# Patient Record
Sex: Female | Born: 1975 | Race: White | Hispanic: No | Marital: Single | Smoking: Former smoker
Health system: Southern US, Community
[De-identification: ages and names within clinical notes are randomized; demographics above are authoritative.]

## PROBLEM LIST (undated history)

## (undated) DIAGNOSIS — R519 Headache, unspecified: Secondary | ICD-10-CM

## (undated) DIAGNOSIS — F1910 Other psychoactive substance abuse, uncomplicated: Secondary | ICD-10-CM

## (undated) DIAGNOSIS — M5030 Other cervical disc degeneration, unspecified cervical region: Secondary | ICD-10-CM

## (undated) DIAGNOSIS — M1990 Unspecified osteoarthritis, unspecified site: Secondary | ICD-10-CM

## (undated) DIAGNOSIS — E7800 Pure hypercholesterolemia, unspecified: Secondary | ICD-10-CM

## (undated) DIAGNOSIS — E039 Hypothyroidism, unspecified: Secondary | ICD-10-CM

## (undated) DIAGNOSIS — F29 Unspecified psychosis not due to a substance or known physiological condition: Secondary | ICD-10-CM

## (undated) DIAGNOSIS — F32A Depression, unspecified: Secondary | ICD-10-CM

## (undated) DIAGNOSIS — I10 Essential (primary) hypertension: Secondary | ICD-10-CM

## (undated) DIAGNOSIS — Z87442 Personal history of urinary calculi: Secondary | ICD-10-CM

## (undated) HISTORY — PX: APPENDECTOMY: SHX54

## (undated) HISTORY — PX: CYSTOSCOPY: SUR368

---

## 2005-06-18 ENCOUNTER — Emergency Department (HOSPITAL_COMMUNITY): Admission: EM | Admit: 2005-06-18 | Discharge: 2005-06-18 | Payer: Self-pay | Admitting: Emergency Medicine

## 2005-10-15 ENCOUNTER — Emergency Department (HOSPITAL_COMMUNITY): Admission: EM | Admit: 2005-10-15 | Discharge: 2005-10-15 | Payer: Self-pay | Admitting: Emergency Medicine

## 2006-08-20 ENCOUNTER — Encounter: Admission: RE | Admit: 2006-08-20 | Discharge: 2006-08-20 | Payer: Self-pay | Admitting: Obstetrics and Gynecology

## 2008-02-04 ENCOUNTER — Ambulatory Visit: Payer: Self-pay | Admitting: Psychiatry

## 2008-02-11 ENCOUNTER — Emergency Department (HOSPITAL_COMMUNITY): Admission: EM | Admit: 2008-02-11 | Discharge: 2008-02-12 | Payer: Self-pay | Admitting: Emergency Medicine

## 2008-05-08 ENCOUNTER — Ambulatory Visit: Payer: Self-pay | Admitting: Family Medicine

## 2008-05-08 DIAGNOSIS — F329 Major depressive disorder, single episode, unspecified: Secondary | ICD-10-CM

## 2008-05-08 DIAGNOSIS — M542 Cervicalgia: Secondary | ICD-10-CM | POA: Insufficient documentation

## 2008-05-08 DIAGNOSIS — E039 Hypothyroidism, unspecified: Secondary | ICD-10-CM | POA: Insufficient documentation

## 2008-05-08 DIAGNOSIS — Z87442 Personal history of urinary calculi: Secondary | ICD-10-CM | POA: Insufficient documentation

## 2008-05-08 DIAGNOSIS — F32A Depression, unspecified: Secondary | ICD-10-CM | POA: Insufficient documentation

## 2008-05-11 ENCOUNTER — Telehealth: Payer: Self-pay | Admitting: Family Medicine

## 2008-05-20 ENCOUNTER — Telehealth: Payer: Self-pay | Admitting: *Deleted

## 2008-05-20 ENCOUNTER — Telehealth (INDEPENDENT_AMBULATORY_CARE_PROVIDER_SITE_OTHER): Payer: Self-pay | Admitting: *Deleted

## 2008-06-03 ENCOUNTER — Telehealth: Payer: Self-pay | Admitting: Family Medicine

## 2008-06-09 ENCOUNTER — Telehealth: Payer: Self-pay | Admitting: Family Medicine

## 2008-06-23 ENCOUNTER — Telehealth: Payer: Self-pay | Admitting: Family Medicine

## 2008-06-23 ENCOUNTER — Encounter: Payer: Self-pay | Admitting: Family Medicine

## 2008-07-03 ENCOUNTER — Encounter: Payer: Self-pay | Admitting: Family Medicine

## 2009-01-27 ENCOUNTER — Emergency Department (HOSPITAL_COMMUNITY): Admission: EM | Admit: 2009-01-27 | Discharge: 2009-01-28 | Payer: Self-pay | Admitting: Emergency Medicine

## 2009-04-08 ENCOUNTER — Emergency Department (HOSPITAL_COMMUNITY): Admission: EM | Admit: 2009-04-08 | Discharge: 2009-04-09 | Payer: Self-pay | Admitting: Emergency Medicine

## 2009-04-20 ENCOUNTER — Emergency Department (HOSPITAL_COMMUNITY): Admission: EM | Admit: 2009-04-20 | Discharge: 2009-04-21 | Payer: Self-pay | Admitting: Emergency Medicine

## 2009-07-21 ENCOUNTER — Emergency Department (HOSPITAL_COMMUNITY): Admission: EM | Admit: 2009-07-21 | Discharge: 2009-07-21 | Payer: Self-pay | Admitting: Emergency Medicine

## 2009-11-17 ENCOUNTER — Emergency Department (HOSPITAL_COMMUNITY): Admission: EM | Admit: 2009-11-17 | Discharge: 2009-11-17 | Payer: Self-pay | Admitting: Emergency Medicine

## 2010-08-24 LAB — DIFFERENTIAL
Basophils Absolute: 0.1 10*3/uL (ref 0.0–0.1)
Basophils Relative: 1 % (ref 0–1)
Eosinophils Absolute: 0.1 10*3/uL (ref 0.0–0.7)
Eosinophils Absolute: 0.1 10*3/uL (ref 0.0–0.7)
Eosinophils Relative: 1 % (ref 0–5)
Eosinophils Relative: 1 % (ref 0–5)
Lymphocytes Relative: 27 % (ref 12–46)
Lymphs Abs: 2.9 10*3/uL (ref 0.7–4.0)
Monocytes Absolute: 0.5 10*3/uL (ref 0.1–1.0)
Monocytes Absolute: 0.6 10*3/uL (ref 0.1–1.0)
Monocytes Relative: 5 % (ref 3–12)
Monocytes Relative: 5 % (ref 3–12)

## 2010-08-24 LAB — POCT I-STAT, CHEM 8
Calcium, Ion: 1.17 mmol/L (ref 1.12–1.32)
Calcium, Ion: 1.19 mmol/L (ref 1.12–1.32)
Chloride: 102 mEq/L (ref 96–112)
Chloride: 103 mEq/L (ref 96–112)
Creatinine, Ser: 0.7 mg/dL (ref 0.4–1.2)
Glucose, Bld: 78 mg/dL (ref 70–99)
Glucose, Bld: 89 mg/dL (ref 70–99)
HCT: 41 % (ref 36.0–46.0)
HCT: 42 % (ref 36.0–46.0)
Potassium: 3.8 mEq/L (ref 3.5–5.1)
TCO2: 27 mmol/L (ref 0–100)

## 2010-08-24 LAB — URINALYSIS, ROUTINE W REFLEX MICROSCOPIC
Bilirubin Urine: NEGATIVE
Glucose, UA: NEGATIVE mg/dL
Hgb urine dipstick: NEGATIVE
Ketones, ur: NEGATIVE mg/dL
Nitrite: NEGATIVE
Specific Gravity, Urine: 1.014 (ref 1.005–1.030)
pH: 6.5 (ref 5.0–8.0)

## 2010-08-24 LAB — URINE CULTURE: Colony Count: 7000

## 2010-08-24 LAB — CBC
HCT: 37.8 % (ref 36.0–46.0)
Hemoglobin: 12.6 g/dL (ref 12.0–15.0)
MCHC: 33.9 g/dL (ref 30.0–36.0)
MCV: 93.9 fL (ref 78.0–100.0)
MCV: 94 fL (ref 78.0–100.0)
RDW: 12.7 % (ref 11.5–15.5)
WBC: 10.1 10*3/uL (ref 4.0–10.5)

## 2010-09-02 ENCOUNTER — Emergency Department (HOSPITAL_COMMUNITY): Payer: Self-pay

## 2010-09-02 ENCOUNTER — Emergency Department (HOSPITAL_COMMUNITY)
Admission: EM | Admit: 2010-09-02 | Discharge: 2010-09-03 | Disposition: A | Discharge status: Home / Self Care | Payer: Self-pay | Attending: Emergency Medicine | Admitting: Emergency Medicine

## 2010-09-02 DIAGNOSIS — M542 Cervicalgia: Secondary | ICD-10-CM | POA: Insufficient documentation

## 2010-09-02 DIAGNOSIS — R059 Cough, unspecified: Secondary | ICD-10-CM | POA: Insufficient documentation

## 2010-09-02 DIAGNOSIS — R05 Cough: Secondary | ICD-10-CM | POA: Insufficient documentation

## 2010-09-02 DIAGNOSIS — R569 Unspecified convulsions: Secondary | ICD-10-CM | POA: Insufficient documentation

## 2010-09-02 DIAGNOSIS — E039 Hypothyroidism, unspecified: Secondary | ICD-10-CM | POA: Insufficient documentation

## 2010-09-02 DIAGNOSIS — R109 Unspecified abdominal pain: Secondary | ICD-10-CM | POA: Insufficient documentation

## 2010-09-02 DIAGNOSIS — R079 Chest pain, unspecified: Secondary | ICD-10-CM | POA: Insufficient documentation

## 2010-09-02 DIAGNOSIS — F411 Generalized anxiety disorder: Secondary | ICD-10-CM | POA: Insufficient documentation

## 2010-09-02 DIAGNOSIS — F988 Other specified behavioral and emotional disorders with onset usually occurring in childhood and adolescence: Secondary | ICD-10-CM | POA: Insufficient documentation

## 2010-09-02 DIAGNOSIS — R0989 Other specified symptoms and signs involving the circulatory and respiratory systems: Secondary | ICD-10-CM | POA: Insufficient documentation

## 2010-09-02 DIAGNOSIS — R0609 Other forms of dyspnea: Secondary | ICD-10-CM | POA: Insufficient documentation

## 2010-09-02 DIAGNOSIS — G8929 Other chronic pain: Secondary | ICD-10-CM | POA: Insufficient documentation

## 2010-09-02 DIAGNOSIS — R51 Headache: Secondary | ICD-10-CM | POA: Insufficient documentation

## 2010-09-02 DIAGNOSIS — R55 Syncope and collapse: Secondary | ICD-10-CM | POA: Insufficient documentation

## 2010-09-02 LAB — DIFFERENTIAL
Basophils Absolute: 0 10*3/uL (ref 0.0–0.1)
Basophils Relative: 0 % (ref 0–1)
Eosinophils Absolute: 0 10*3/uL (ref 0.0–0.7)
Neutro Abs: 19.2 10*3/uL — ABNORMAL HIGH (ref 1.7–7.7)
Neutrophils Relative %: 89 % — ABNORMAL HIGH (ref 43–77)

## 2010-09-02 LAB — CBC
Hemoglobin: 12.7 g/dL (ref 12.0–15.0)
MCH: 31 pg (ref 26.0–34.0)
Platelets: 381 10*3/uL (ref 150–400)
RBC: 4.1 MIL/uL (ref 3.87–5.11)
WBC: 21.4 10*3/uL — ABNORMAL HIGH (ref 4.0–10.5)

## 2010-09-02 LAB — COMPREHENSIVE METABOLIC PANEL
AST: 22 U/L (ref 0–37)
Albumin: 4.2 g/dL (ref 3.5–5.2)
Alkaline Phosphatase: 63 U/L (ref 39–117)
BUN: 8 mg/dL (ref 6–23)
Chloride: 109 mEq/L (ref 96–112)
Creatinine, Ser: 0.75 mg/dL (ref 0.4–1.2)
GFR calc Af Amer: >60 mL/min (ref >60)
Potassium: 4.1 mEq/L (ref 3.5–5.1)
Total Bilirubin: 0.5 mg/dL (ref 0.3–1.2)
Total Protein: 7 g/dL (ref 6.0–8.3)

## 2010-09-02 LAB — POCT CARDIAC MARKERS
CKMB, poc: <1 ng/mL — ABNORMAL LOW (ref 1.0–8.0)
Troponin i, poc: <0.05 ng/mL (ref 0.00–0.09)

## 2010-09-02 LAB — POCT PREGNANCY, URINE: Preg Test, Ur: NEGATIVE

## 2010-09-02 LAB — ETHANOL: Alcohol, Ethyl (B): 6 mg/dL (ref 0–10)

## 2010-09-03 LAB — URINALYSIS, ROUTINE W REFLEX MICROSCOPIC
Bilirubin Urine: NEGATIVE
Ketones, ur: NEGATIVE mg/dL
Leukocytes, UA: NEGATIVE
Nitrite: NEGATIVE
Urobilinogen, UA: 0.2 mg/dL (ref 0.0–1.0)

## 2010-09-03 LAB — RAPID URINE DRUG SCREEN, HOSP PERFORMED
Amphetamines: NOT DETECTED
Benzodiazepines: POSITIVE — AB
Cocaine: NOT DETECTED
Opiates: NOT DETECTED
Tetrahydrocannabinol: NOT DETECTED

## 2011-02-16 IMAGING — CR DG KNEE COMPLETE 4+V*R*
1 series · 1 of 1 positions shown · non-contrast
Comparison: Contralateral extremity same day.

CLINICAL DATA: Knee pain.  Fall.

RIGHT KNEE - COMPLETE 4+ VIEW

[t knee ap left]
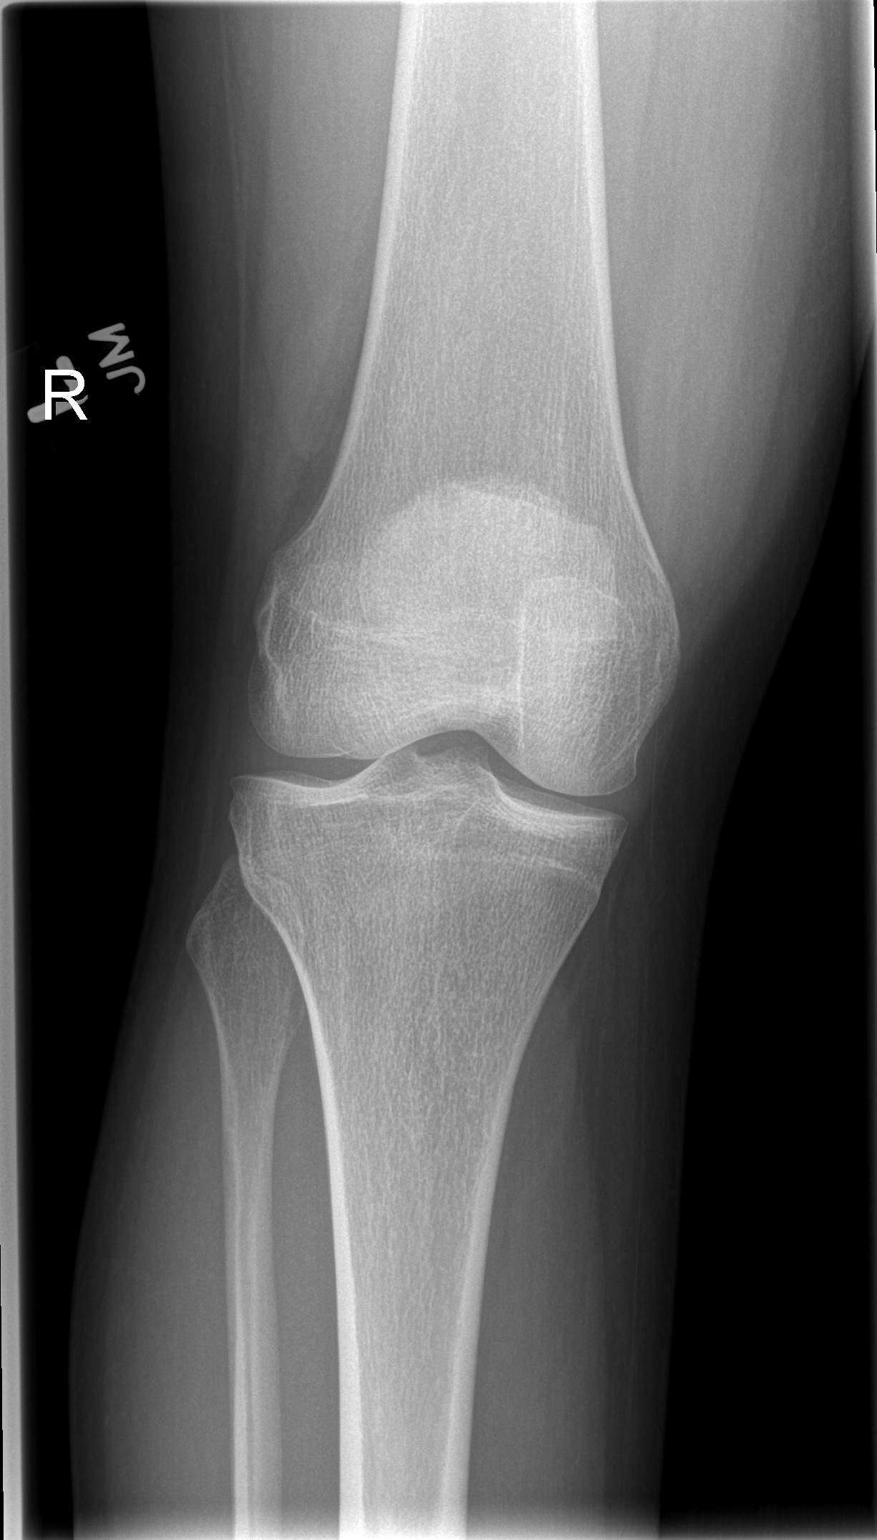

[1 of 1 positions shown; findings below may reference images not displayed]

FINDINGS: Please note that these films are incorrectly accessed
under the contralateral extremity.  The dictation correctly
[REDACTED] side with report title.  The alignment of the right knee
is anatomic.  No fracture is identified.  Question small effusion.
IMPRESSION: Question small effusion.  No fracture.

## 2011-02-16 IMAGING — CR DG CERVICAL SPINE FLEX&EXT ONLY
2 series · 2 of 2 positions shown · non-contrast
Comparison: None.

CLINICAL DATA: Cervical kyphosis noted on CT today.

CERVICAL SPINE - FLEXION AND EXTENSION VIEWS ONLY

[w c-spine flexion]
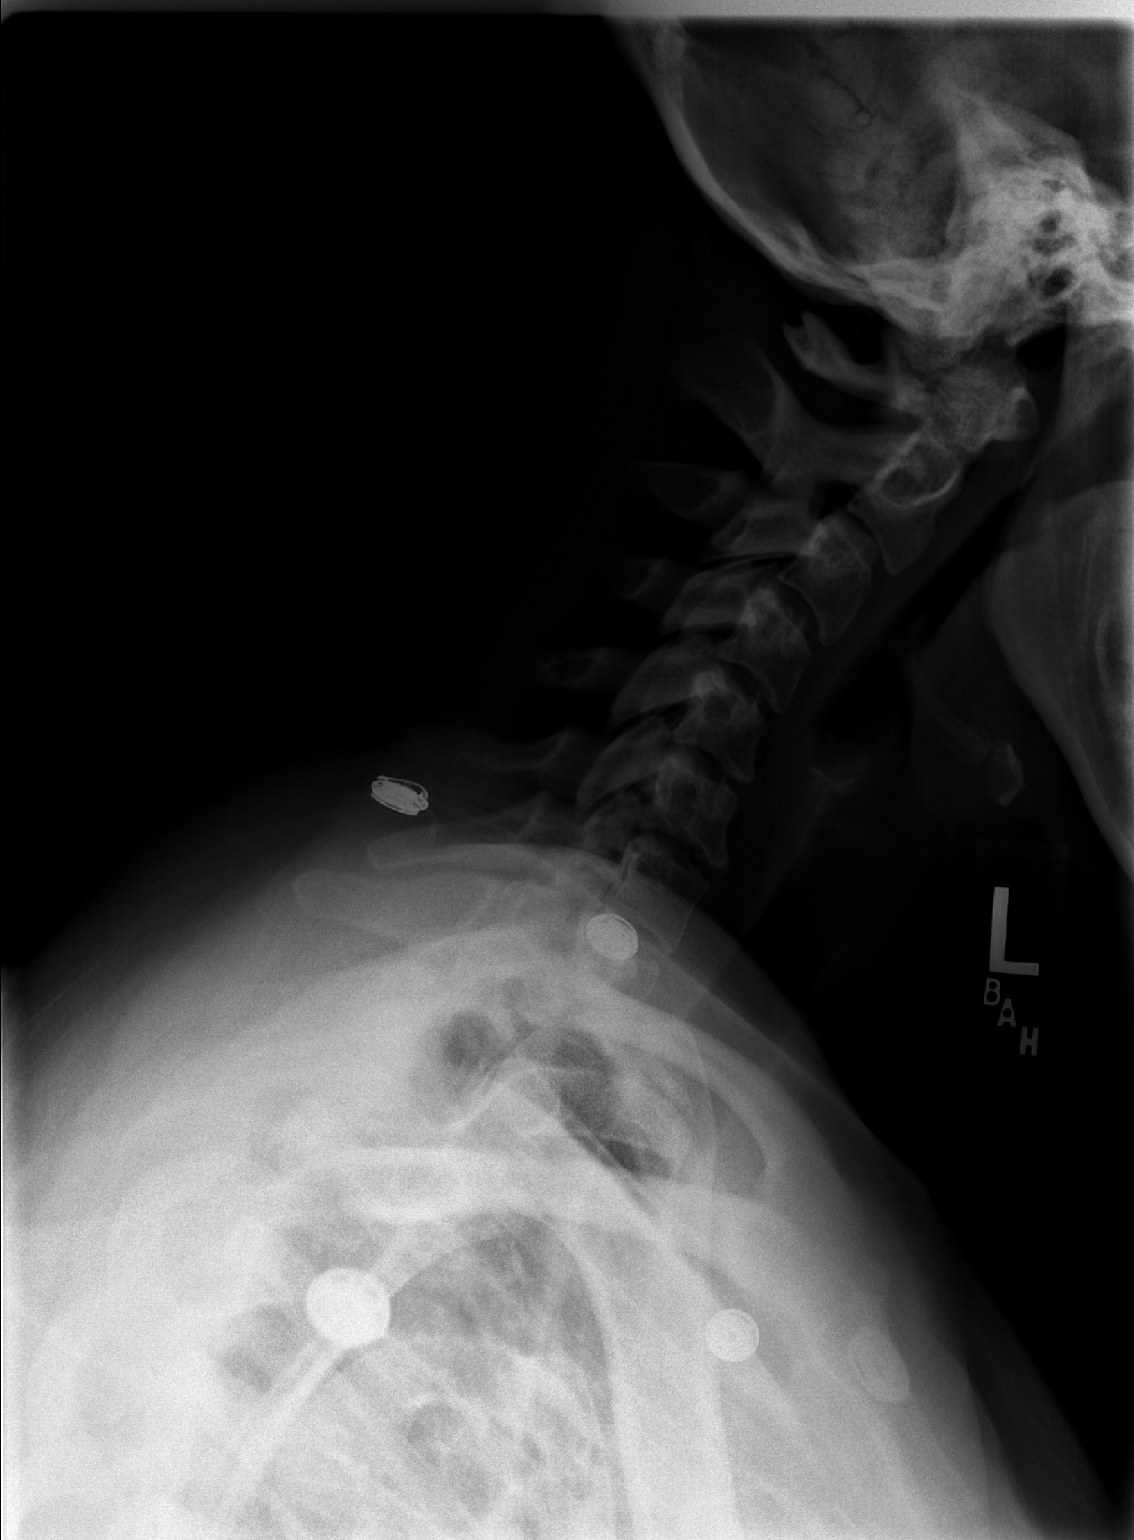

[w c-spine extension]
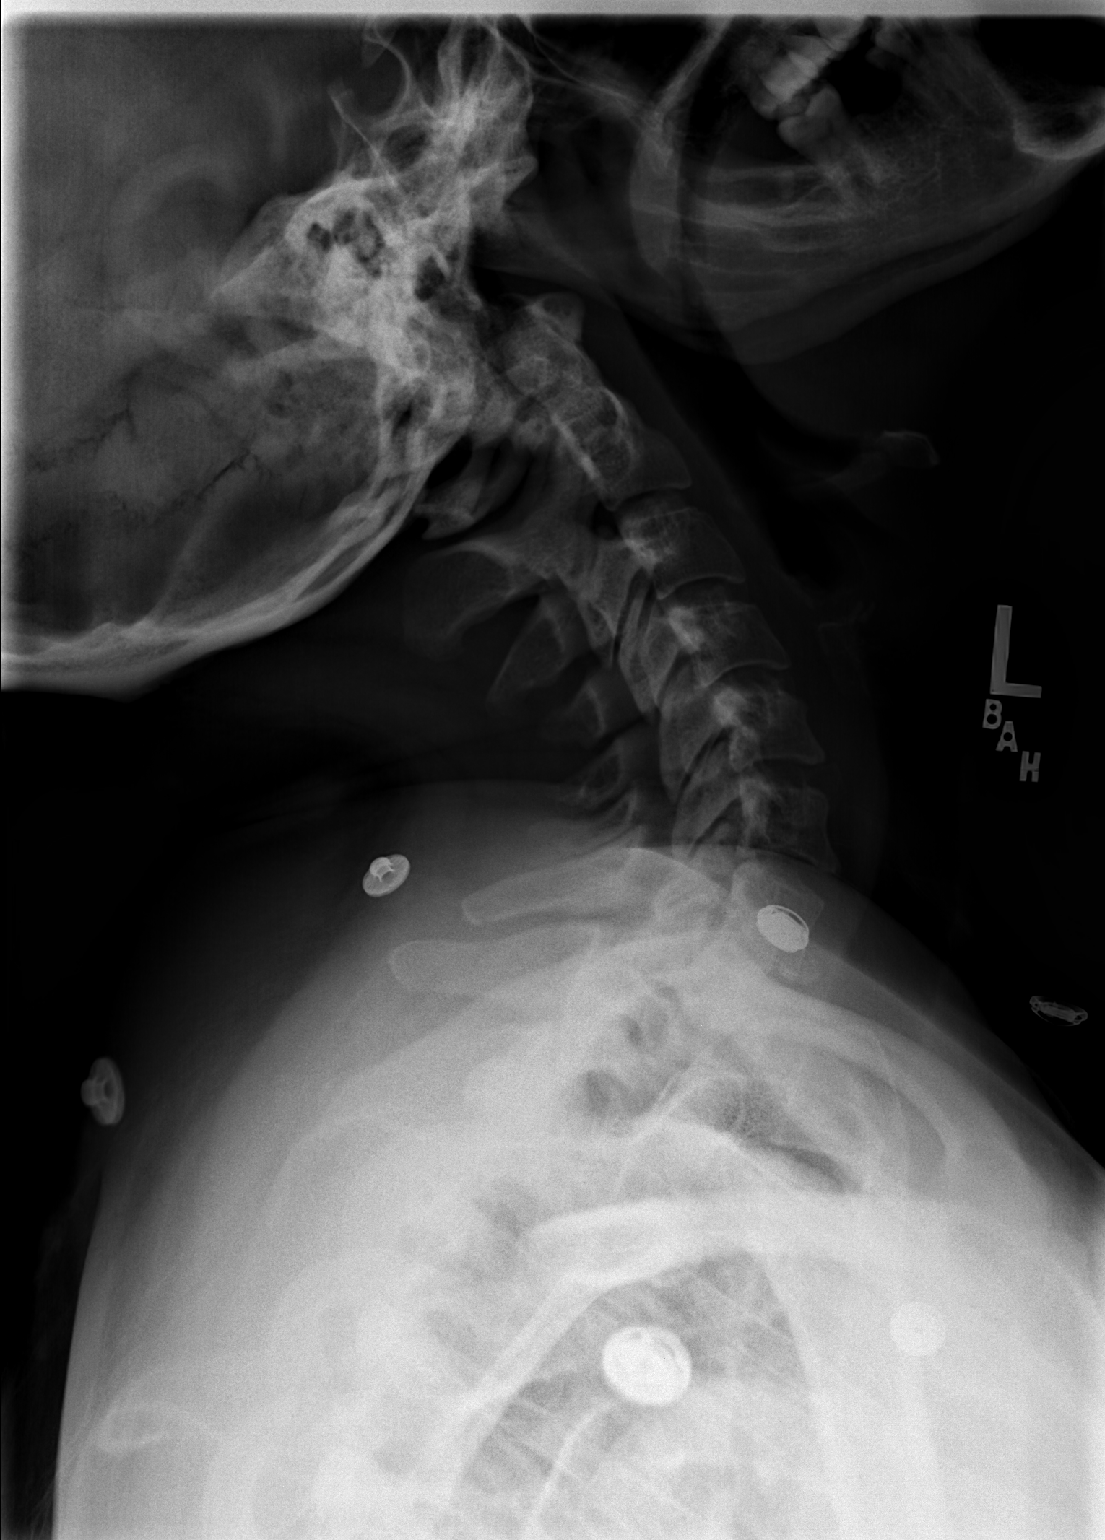

[2 of 2 positions shown; findings below may reference images not displayed]

FINDINGS: There is good range of motion with flexion and extension.
No subluxation or bony distraction.  No prevertebral soft tissue
swelling.
IMPRESSION: Negative C-spine views in flexion and extension.  No signs to
suggest instability.

## 2011-02-20 LAB — URINALYSIS, ROUTINE W REFLEX MICROSCOPIC
Ketones, ur: NEGATIVE
Leukocytes, UA: NEGATIVE
Nitrite: NEGATIVE
pH: 6.5

## 2011-02-20 LAB — CBC
Platelets: 523 — ABNORMAL HIGH
RDW: 12.6
WBC: 10

## 2011-02-20 LAB — POCT I-STAT, CHEM 8
Calcium, Ion: 1.14
Chloride: 105
HCT: 39
Potassium: 3.4 — ABNORMAL LOW
Sodium: 142

## 2011-02-20 LAB — DIFFERENTIAL
Basophils Absolute: 0.1
Eosinophils Relative: 1
Lymphocytes Relative: 25
Neutro Abs: 6.7
Neutrophils Relative %: 68

## 2011-02-20 LAB — POCT PREGNANCY, URINE: Preg Test, Ur: NEGATIVE

## 2011-02-20 LAB — URINE MICROSCOPIC-ADD ON
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
## 2017-11-26 ENCOUNTER — Encounter

## 2017-11-26 ENCOUNTER — Encounter: Payer: Self-pay | Admitting: Neurology

## 2017-11-26 ENCOUNTER — Ambulatory Visit: Payer: Commercial Managed Care - PPO | Admitting: Neurology

## 2017-11-26 DIAGNOSIS — M5412 Radiculopathy, cervical region: Secondary | ICD-10-CM | POA: Insufficient documentation

## 2017-11-26 DIAGNOSIS — M5030 Other cervical disc degeneration, unspecified cervical region: Secondary | ICD-10-CM | POA: Diagnosis not present

## 2017-11-26 DIAGNOSIS — M542 Cervicalgia: Secondary | ICD-10-CM | POA: Diagnosis not present

## 2017-11-26 DIAGNOSIS — M5416 Radiculopathy, lumbar region: Secondary | ICD-10-CM | POA: Diagnosis not present

## 2017-11-26 MED ORDER — TIZANIDINE HCL 4 MG PO TABS: ORAL_TABLET | ORAL | 3 refills | Status: DC

## 2017-11-26 MED ORDER — ETODOLAC 400 MG PO TABS: 400.0000 mg | ORAL_TABLET | Freq: Two times a day (BID) | ORAL | 5 refills | Status: DC

## 2017-11-26 MED ORDER — BUSPIRONE HCL 15 MG PO TABS: 15.0000 mg | ORAL_TABLET | Freq: Two times a day (BID) | ORAL | 5 refills | Status: DC
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
## 2017-11-27 ENCOUNTER — Telehealth: Payer: Self-pay | Admitting: Neurology

## 2017-11-27 NOTE — Telephone Encounter (Signed)
MR Cervical spine wo contrast Dr. Trilby LeaverSater [facility] Auth: [id number] (exp. 11/26/17 to 12/25/17). [facility]

## 2017-12-04 ENCOUNTER — Other Ambulatory Visit: Payer: Commercial Managed Care - PPO

## 2017-12-12 ENCOUNTER — Other Ambulatory Visit: Payer: Commercial Managed Care - PPO

## 2017-12-25 ENCOUNTER — Telehealth: Payer: Self-pay | Admitting: Neurology

## 2017-12-25 NOTE — Telephone Encounter (Signed)
Patient calling to reschedule her MRI appointment she had missed.

## 2017-12-26 NOTE — Telephone Encounter (Signed)
Got extension on the auth number of [id number] (exp. 01/24/18)

## 2017-12-26 NOTE — Telephone Encounter (Signed)
Left a voicemail for patient to call me back about rs her mri.

## 2017-12-27 MED ORDER — GABAPENTIN 600 MG PO TABS: 600.0000 mg | ORAL_TABLET | Freq: Three times a day (TID) | ORAL | 1 refills | Status: DC

## 2017-12-27 NOTE — Addendum Note (Signed)
Addended by: Candis SchatzMISENHEIMER, Mychal Durio I on: 12/27/2017 03:38 PM   Modules accepted: Orders

## 2017-12-27 NOTE — Telephone Encounter (Signed)
Patient returned my call and she is [facility].  That patient also informed me she would like to try  gabapentin.

## 2017-12-27 NOTE — Telephone Encounter (Signed)
Spoke with Kimberly Hahn.  She sts. she was on Gabapentin 600mg  QID in the past--did not see enough benefit to continue it but would l ike to try again. Ok per [facility] for 600mg  tid, and for the first 2 wks, only take 1/2 tab tid. Pt. agreeable with this plan. [facility]fim

## 2018-01-01 ENCOUNTER — Other Ambulatory Visit: Payer: Commercial Managed Care - PPO

## 2018-01-08 ENCOUNTER — Other Ambulatory Visit: Payer: Commercial Managed Care - PPO

## 2018-01-10 ENCOUNTER — Ambulatory Visit: Payer: Commercial Managed Care - PPO | Admitting: Neurology

## 2018-01-11 ENCOUNTER — Encounter: Payer: Self-pay | Admitting: Neurology

## 2018-02-14 ENCOUNTER — Telehealth: Payer: Self-pay | Admitting: Neurology

## 2018-02-14 NOTE — Telephone Encounter (Signed)
Spoke with [place]. She was seen in July for neck/back pain. Had some abnormal reflexes and so MRI c-spine was ordered. She noshowed her MRI appt., as well as the f/u appt. with [facility]. She asks if she can increase Gabapentin. I have explained that Gabapentin is increasingly a more monitored medication; already is categorized as a controlled substance in some states. Also, due to abnormal reflexes, Dr. Epimenio Foot really wants to get the MRI to make sure there is nothing that needs to be more urgently addressed. It is more important to have the MRI than mask sx. with increases in medication. She sts. [facility] told her she would need to bring $75 copay to the MRI.  Sts. she can't pay for MRI right now b/c she now owes [facility] a $50 fee for noshowed appt.  I explained I will check with billing to see if the noshow fee can be waived so that she can go ahead and have her MRI.  She verbalized understanding of same/fim

## 2018-02-14 NOTE — Telephone Encounter (Signed)
Pt is taking gabapentin (NEURONTIN) 600 MG tablet 3x day but has increased to 4tabs to get sleep benefit. She is wanting to discuss increasing the dose. She wanted to schedule an appt but has a no show fee to pay but will be unable to until next Friday. I transferred her to billing. Please call to advise.

## 2018-02-26 NOTE — Telephone Encounter (Signed)
I have left a voicemail for patient to call back about r/s her mri.

## 2018-02-26 NOTE — Telephone Encounter (Signed)
Patient has rescheduled her MRI for 03/05/18 at [facility].

## 2018-03-05 ENCOUNTER — Ambulatory Visit: Payer: Commercial Managed Care - PPO

## 2018-03-05 DIAGNOSIS — M542 Cervicalgia: Secondary | ICD-10-CM

## 2018-03-05 DIAGNOSIS — M5030 Other cervical disc degeneration, unspecified cervical region: Secondary | ICD-10-CM

## 2018-03-07 ENCOUNTER — Telehealth: Payer: Self-pay | Admitting: *Deleted

## 2018-03-07 NOTE — Telephone Encounter (Signed)
LMOM (identified vm) with below MRI report.  She does not need to return this call unless she has questions/fim 

## 2018-03-07 NOTE — Telephone Encounter (Signed)
-----   Message from Asa Lente, MD sent at 03/06/2018  6:13 PM EDT ----- Please let her know that the MRI of the cervical spine shows degenerative changes at C4-C5, C5-C6 and C6-C7.  There is no nerve root compression but that those changes could be causing some neck pain.  It does not look bad enough to refer to surgery.

## 2018-03-11 MED ORDER — GABAPENTIN 600 MG PO TABS: 600.0000 mg | ORAL_TABLET | Freq: Four times a day (QID) | ORAL | 3 refills | Status: DC

## 2018-03-11 NOTE — Telephone Encounter (Signed)
I called patient back. I relayed results of MRI per Dr. Epimenio Foot note below. She verbalized understanding.   She is wondering if Dr. Epimenio Foot can increase her dose of gabapentin. She is taking 600mg  (1 tablet TID).  She is also taking ibuprofen 800mg  daily (4-200mg  tablets) for swelling. Added to med list. She states the etodolac upset her stomach and she is unable to take this. I took off med list and added to allergy list.   Advised I will speak with Dr. Epimenio Foot and call back to advise. She verbalized understanding.

## 2018-03-11 NOTE — Addendum Note (Signed)
Addended by: Eilene Ghazi L on: 03/11/2018 10:50 AM   Modules accepted: Orders

## 2018-03-11 NOTE — Telephone Encounter (Signed)
Pt has returned the call to RN Faith, she is asking for a call back °

## 2018-03-11 NOTE — Telephone Encounter (Signed)
I called pt. Relayed Dr. Bonnita Hollow recommendations. She is agreeable to try this. She would like new rx called into pharmacy on file. Advised I will do this for her. She verbalized understanding and appreciation.

## 2018-03-11 NOTE — Addendum Note (Signed)
Addended by: Hillis Range on: 03/11/2018 02:01 PM   Modules accepted: Orders

## 2018-03-11 NOTE — Telephone Encounter (Signed)
Per Dr. Epimenio Foot- okay to increase her gabapentin 600mg  tab to 1 tab QID. She can take one in the morning, 1 in the afternoon and 2 at bed or take 1 tab four times daily.

## 2018-04-23 ENCOUNTER — Other Ambulatory Visit: Payer: Self-pay | Admitting: Neurology

## 2018-06-03 ENCOUNTER — Other Ambulatory Visit: Payer: Self-pay | Admitting: Neurology

## 2018-06-04 ENCOUNTER — Ambulatory Visit: Payer: Commercial Managed Care - PPO | Admitting: Neurology

## 2018-06-04 ENCOUNTER — Encounter

## 2018-07-03 ENCOUNTER — Other Ambulatory Visit: Payer: Self-pay | Admitting: Neurology

## 2018-07-26 ENCOUNTER — Other Ambulatory Visit: Payer: Self-pay | Admitting: Neurology

## 2018-08-06 ENCOUNTER — Other Ambulatory Visit: Payer: Self-pay | Admitting: Neurology

## 2018-08-09 ENCOUNTER — Telehealth: Payer: Self-pay | Admitting: *Deleted

## 2018-08-09 NOTE — Telephone Encounter (Signed)
Called, LVM letting pt know appt 08/12/18 cx d/t concerns over covid-19. Advised I will f/u Monday about getting this appt r/s.

## 2018-08-12 ENCOUNTER — Ambulatory Visit: Payer: Self-pay | Admitting: Neurology

## 2018-08-13 ENCOUNTER — Telehealth: Payer: Self-pay | Admitting: Neurology

## 2018-08-13 ENCOUNTER — Other Ambulatory Visit: Payer: Self-pay

## 2018-08-13 ENCOUNTER — Telehealth (INDEPENDENT_AMBULATORY_CARE_PROVIDER_SITE_OTHER): Payer: Self-pay | Admitting: Neurology

## 2018-08-13 NOTE — Telephone Encounter (Signed)
Pt called and wanted to inform us that she did get the providers message and would like to be able to touch base with the provider. She wanted to apologize that she was not able to pick up. Pt had an emergency meeting at work and it was out of her hands. Please advise .

## 2018-08-13 NOTE — Progress Notes (Signed)
Note entered in error.  Visit performed 08/14/2018

## 2018-08-13 NOTE — Telephone Encounter (Signed)
We can get her on the schedule for Wed or Thurs

## 2018-08-14 ENCOUNTER — Other Ambulatory Visit: Payer: Self-pay

## 2018-08-14 ENCOUNTER — Ambulatory Visit (INDEPENDENT_AMBULATORY_CARE_PROVIDER_SITE_OTHER): Payer: Commercial Managed Care - PPO | Admitting: Neurology

## 2018-08-14 ENCOUNTER — Encounter: Payer: Self-pay | Admitting: Neurology

## 2018-08-14 DIAGNOSIS — M542 Cervicalgia: Secondary | ICD-10-CM | POA: Diagnosis not present

## 2018-08-14 DIAGNOSIS — M5412 Radiculopathy, cervical region: Secondary | ICD-10-CM | POA: Diagnosis not present

## 2018-08-14 DIAGNOSIS — M5416 Radiculopathy, lumbar region: Secondary | ICD-10-CM | POA: Diagnosis not present

## 2018-08-14 DIAGNOSIS — G4700 Insomnia, unspecified: Secondary | ICD-10-CM | POA: Insufficient documentation

## 2018-08-14 DIAGNOSIS — F329 Major depressive disorder, single episode, unspecified: Secondary | ICD-10-CM

## 2018-08-14 DIAGNOSIS — F32A Depression, unspecified: Secondary | ICD-10-CM

## 2018-08-14 MED ORDER — GABAPENTIN 800 MG PO TABS: 800.0000 mg | ORAL_TABLET | Freq: Four times a day (QID) | ORAL | 11 refills | Status: DC

## 2018-08-14 MED ORDER — TRAMADOL HCL 50 MG PO TABS: 50.0000 mg | ORAL_TABLET | Freq: Four times a day (QID) | ORAL | 1 refills | Status: DC | PRN

## 2018-08-14 MED ORDER — DICLOFENAC SODIUM 50 MG PO TBEC: 50.0000 mg | DELAYED_RELEASE_TABLET | Freq: Two times a day (BID) | ORAL | 5 refills | Status: DC

## 2018-08-14 MED ORDER — ESZOPICLONE 2 MG PO TABS: 2.0000 mg | ORAL_TABLET | Freq: Every evening | ORAL | 1 refills | Status: DC | PRN

## 2018-08-14 NOTE — Telephone Encounter (Signed)
I called pt back. She will do telephone visit with Dr. Epimenio Foot today at 1030am. I placed on schedule.  I updated med list/allergies/pharmacy list. She will use either pharmacy on file.

## 2018-08-14 NOTE — Progress Notes (Signed)
Virtual Visit via Telephone Note I connected withLorraine Hahn on 08/14/18 at 10:45 AM EDT by telephoneand verified that I am speaking with the correct person using two identifiers.  I discussed the limitations, risks, security and privacy concerns of performing an evaluation and management service by telephone and the availability of in person appointments. I also discussed with the patient that there may be a patient responsible charge related to this service. The patient expressed understanding and agreed to proceed.   History of Present Illness: She is feeling the neck pain is radiating further down into the left shoulder.   Pain is constant but worse by the late afternoon or evening.    She denies any numbness or weakness in the left arm but has some right sided numbness.    She takes gabapentin 600 mg po 3-4 and tizanidine and ibuprofen 800 mg.  We discussed her previous MRI of the cervical spine and images were reviewed.  She notes no change in legs or gait.    She has DM and is urinating more.    She has left hip pain that is worse when she walks.  Her sister died yesterday and she is more stressed and tearful.  IMPRESSION C-spine 03/05/2018: This MRI of the cervical spine shows multilevel degenerative changes as detailed above. The most significant findings are at C4-C5, C5-C6 and C6-C7 with there are degenerative changes causing moderate foraminal narrowing but no nerve root compression or spinal stenosis.    Observations/Objective:   Assessment and Plan: Neck pain  Cervical radiculopathy  Lumbar radiculopathy  Depression, unspecified depression type  Insomnia, unspecified type  1.   For her neck pain and radiculopathy, I will change the ibuprofen to diclofenac 50 mg twice daily, increase the gabapentin and add tramadol 50 mg up to 3 times a day as needed. 2.   For insomnia I will write her for eszopiclone. 3.   She is advised to stay active and exercise as  tolerated. 4.   If depression worsens she should contact her primary care physician or [place]. 5.   Return in 4 months or sooner if there are new or worsening neurologic symptoms.   Follow Up Instructions: I discussed the assessment and treatment plan with the patient. The patient was provided an opportunity to ask questions and all were answered. The patient agreed with the plan and demonstrated an understanding of the instructions.  The patient was advised to call back or seek an in-person evaluation if the symptoms worsen or if the condition fails to improve as anticipated.  I provided 22 minutes of non-face-to-face time during this encounter.   Asa Lente, MD

## 2018-09-06 ENCOUNTER — Other Ambulatory Visit: Payer: Self-pay | Admitting: Neurology

## 2018-09-16 ENCOUNTER — Telehealth: Payer: Self-pay | Admitting: Neurology
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
## 2018-09-16 NOTE — Telephone Encounter (Signed)
Dr. Epimenio Foot- I called pharmacy and she last refilled gabapentin 09/06/18. She cannot refill until 10/06/18. She is saying she misplaced gabapentin during move. Ok to authorize a early refill?

## 2018-09-16 NOTE — Telephone Encounter (Signed)
Pt called wanting to speak to RN about her medications and a f/u visit. She also wants to talk about the last conversation she had with the provider. Pt would not go into details. Please advise.

## 2018-09-17 ENCOUNTER — Ambulatory Visit (INDEPENDENT_AMBULATORY_CARE_PROVIDER_SITE_OTHER): Payer: Commercial Managed Care - PPO | Admitting: Neurology

## 2018-09-17 ENCOUNTER — Encounter: Payer: Self-pay | Admitting: Neurology

## 2018-09-17 ENCOUNTER — Other Ambulatory Visit: Payer: Self-pay

## 2018-09-17 DIAGNOSIS — M25512 Pain in left shoulder: Secondary | ICD-10-CM

## 2018-09-17 DIAGNOSIS — G4700 Insomnia, unspecified: Secondary | ICD-10-CM | POA: Diagnosis not present

## 2018-09-17 DIAGNOSIS — M5412 Radiculopathy, cervical region: Secondary | ICD-10-CM | POA: Diagnosis not present

## 2018-09-17 DIAGNOSIS — M542 Cervicalgia: Secondary | ICD-10-CM

## 2018-09-17 MED ORDER — METHYLPREDNISOLONE 4 MG PO TABS: ORAL_TABLET | ORAL | 0 refills | Status: DC

## 2018-09-17 MED ORDER — GABAPENTIN 800 MG PO TABS: 800.0000 mg | ORAL_TABLET | Freq: Four times a day (QID) | ORAL | 11 refills | Status: DC

## 2018-09-17 NOTE — Progress Notes (Signed)
Virtual Visit via Telephone Note  I connected with Kimberly Hahn on 09/17/18 at  2:30 PM EDT by telephone and verified that I am speaking with the correct person.   I discussed the limitations, risks, security and privacy concerns of performing an evaluation and management service by telephone and the availability of in person appointments. I also discussed with the patient that there may be a patient responsible charge related to this service. The patient expressed understanding and agreed to proceed.   History of Present Illness: She is reporting pain in the left shoulder.   The pain seems located in the actual shoulder joint and radiaitng into th adjacent neck but not the arm.   Raising the arm over the head pulls but does not hurt.  Rotating the arm externally is painful.   Holding the arm internally rotated (touch right shoulder with left hand) reduces the pain.   She takes gabapentin, tizanidine and prn tramadol.     She has noted more headache.    The higher dose of gabapentin was helping the neck pain and the intermittent  radiating pain into the arm.   She lost her gabapentin in her recent move and would like an early refill  She has insomnia.   We recently started eszopiclone but it is only helping some nights.    Diclofenac helps neck more than ibuprofen.     Observations/Objective: She is alert and fully oriented with fluent speech and good attention, knowledge and memory.  Assessment and Plan: Acute pain of left shoulder  Neck pain  Insomnia, unspecified type  Cervical radiculopathy   1.   The shoulder pain could be a rotator cuff injury or bursitis.  I will call in a steroid pack to see if that helps.  If pain is not significantly better in another week we could refer to orthopedics for possible rotator cuff injury. 2.   She lost her gabapentin while moving.  I will call in an early refill this 1 time. 3.   Continue diclofenac and eszopiclone.  If insomnia does not improve  consider a switch to a different medicine. 4.   Return as scheduled or sooner if there are new or worsening neurologic symptoms.  Follow Up Instructions: I discussed the assessment and treatment plan with the patient. The patient was provided an opportunity to ask questions and all were answered. The patient agreed with the plan and demonstrated an understanding of the instructions.  The patient was advised to call back or seek an in-person evaluation if the symptoms worsen or if the condition fails to improve as anticipated.  I provided 12 minutes of non-face-to-face time during this encounter.   A. Epimenio Foot, MD, PhD, FAAN Certified in Neurology, Clinical Neurophysiology, Sleep Medicine, Pain Medicine and Neuroimaging Director, [facility]  [facility] [address] [place], [place] [address] [phone]

## 2018-09-19 ENCOUNTER — Other Ambulatory Visit: Payer: Self-pay | Admitting: Neurology

## 2018-09-20 ENCOUNTER — Other Ambulatory Visit: Payer: Self-pay | Admitting: Neurology

## 2018-09-20 NOTE — Telephone Encounter (Signed)
Pt states that her traMADol (ULTRAM) 50 MG tablet was supposed to be called in when she had her last appt and she states that the pharmacy also faxed paperwork on it and they have not received an answer. Please advise.

## 2018-09-23 MED ORDER — TRAMADOL HCL 50 MG PO TABS: 50.0000 mg | ORAL_TABLET | Freq: Four times a day (QID) | ORAL | 3 refills | Status: DC | PRN

## 2018-09-23 NOTE — Telephone Encounter (Signed)
Dr. Epimenio Foot- see below on how pt has been refilling. Is it too early to refill?

## 2018-09-23 NOTE — Telephone Encounter (Signed)
Tried [facility],  at [phone]. They are not open until 9am.  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 I checked drug registry and she last refilled on 09/11/18 #28. She also got #28 on 08/27/18, 09/01/18, 09/05/18.

## 2018-09-23 NOTE — Telephone Encounter (Signed)
Kimberly Hahn We can refill #90 with 3 refills.

## 2018-10-06 ENCOUNTER — Other Ambulatory Visit: Payer: Self-pay | Admitting: Neurology

## 2018-10-10 ENCOUNTER — Telehealth: Payer: Self-pay | Admitting: Neurology

## 2018-10-10 DIAGNOSIS — M5412 Radiculopathy, cervical region: Secondary | ICD-10-CM

## 2018-10-10 DIAGNOSIS — M542 Cervicalgia: Secondary | ICD-10-CM

## 2018-10-10 DIAGNOSIS — M25512 Pain in left shoulder: Secondary | ICD-10-CM

## 2018-10-10 MED ORDER — TRAMADOL HCL 50 MG PO TABS: ORAL_TABLET | ORAL | 0 refills | Status: DC

## 2018-10-10 MED ORDER — METHYLPREDNISOLONE 4 MG PO TABS: ORAL_TABLET | ORAL | 0 refills | Status: DC

## 2018-10-10 NOTE — Telephone Encounter (Signed)
Dr. Epimenio Foot- see message, please advise

## 2018-10-10 NOTE — Telephone Encounter (Signed)
Pt called wanting RN or provider to call her back to discuss her medications and for the referral for an Orthopedic due to her pain not getting any better. Please advise.

## 2018-10-10 NOTE — Telephone Encounter (Signed)
I sent in a steroid pack and will increase the tramadol for one month but do not want her on that high a dose long term  Ok for referral

## 2018-10-10 NOTE — Telephone Encounter (Signed)
Called pt back. She is requesting another steroid pack. Pain in left shoulder has worsened. Has  trouble raising above her head now. Requesting increase in her tramadol dose also. She is taking 50mg  tablet (1 tablet in the am, 2 tabs at lunch, 2 tabs at bedtime). Advised I will discuss with Dr. Epimenio Foot and call back.   She would like referral placed to orthopaedics. No preference as to where. I placed referral.

## 2018-10-10 NOTE — Telephone Encounter (Signed)
Called pt. Relayed Dr. Bonnita Hollow message below. She verbalized understanding and appreciation. She will call if she does not hear about scheduling appt with orthopaedics.

## 2018-10-18 ENCOUNTER — Ambulatory Visit: Payer: Self-pay | Admitting: Orthopaedic Surgery

## 2018-10-22 ENCOUNTER — Ambulatory Visit: Payer: Self-pay | Admitting: Orthopaedic Surgery

## 2018-10-29 ENCOUNTER — Ambulatory Visit: Payer: Self-pay | Admitting: Orthopaedic Surgery

## 2018-10-29 ENCOUNTER — Other Ambulatory Visit: Payer: Self-pay | Admitting: Neurology

## 2018-10-30 NOTE — Telephone Encounter (Signed)
[facility] Verified LR: 10-11-2018 Qty: 150 Pending appointment: No pending appt

## 2018-10-31 ENCOUNTER — Other Ambulatory Visit: Payer: Self-pay | Admitting: Neurology

## 2018-11-06 ENCOUNTER — Ambulatory Visit: Payer: Self-pay | Admitting: Orthopaedic Surgery

## 2018-11-07 ENCOUNTER — Other Ambulatory Visit: Payer: Self-pay | Admitting: Neurology

## 2018-11-11 ENCOUNTER — Telehealth: Payer: Self-pay | Admitting: Neurology

## 2018-11-11 NOTE — Telephone Encounter (Signed)
Called and spoke to patient she is aware of message below.    Kimberly Hahn        Thanks for trusting the care of your patient to our office. We would love to schedule an appointment, however patient was a NO SHOW for their appointment on 11/08/18 with Dr Lorin Mercy and also has cancelled this appointment 3 time(5/29,6/2, & 6/9). At this time we will not reach out to the patient to reschedule, if the appointment is still need patient may contact our office at [phone] ext. Bruce  Referral Coordinator  [place]  [phone]

## 2018-11-25 ENCOUNTER — Telehealth: Payer: Self-pay | Admitting: Neurology

## 2018-11-25 MED ORDER — TRAMADOL HCL 50 MG PO TABS: ORAL_TABLET | ORAL | 0 refills | Status: DC

## 2018-11-25 NOTE — Telephone Encounter (Signed)
Pt is asking for a call from RN to discuss a refill on all of her medications.  Pt states it has been a day and a 1/2 since she has had them.  Pt wants to discuss why with RN.  Please call

## 2018-11-25 NOTE — Telephone Encounter (Signed)
Called pt back. She went out of town for July 4th. She lost her medication bottles. She called hotel they stayed at and not found. Not at home.   Requesting refills on gabapentin and tramadol. Drug registry checked. She last refilled Tramadol 11/07/18 #150. Not due until 12/07/18. Advised I will have to get approval from Dr. Felecia Shelling. Normally controlled substances cannot be refilled early. She will call pharmacy about refill on lunesta. Drug registry checked and she last refilled 11/07/18 #30.  She does not need refill on diclofenac or tizanidine.   She also asked for Dr. Lorin Mercy office number to call and schedule appt. Provided: [phone] ext. [phone].  She recently received promotion at job.

## 2018-11-25 NOTE — Telephone Encounter (Signed)
Called, LVM for pt letting her know we are unable to fill controlled substances early.

## 2018-11-25 NOTE — Addendum Note (Signed)
Addended by: Britt Bottom on: 11/25/2018 04:27 PM   Modules accepted: Orders

## 2018-11-25 NOTE — Telephone Encounter (Signed)
Unfortunately, we cannot refill controlled substances early.

## 2018-11-25 NOTE — Addendum Note (Signed)
Addended by: Hope Pigeon on: 11/25/2018 04:22 PM   Modules accepted: Orders

## 2019-01-07 ENCOUNTER — Other Ambulatory Visit: Payer: Self-pay | Admitting: Neurology

## 2019-01-13 ENCOUNTER — Other Ambulatory Visit: Payer: Self-pay | Admitting: Neurology

## 2019-01-13 NOTE — Telephone Encounter (Deleted)
[place] drug registry has been verified. Last refill was 12/08/2018 # 150 for a 30 day supply.  

## 2019-01-13 NOTE — Telephone Encounter (Signed)
[place] drug registry has been verified. Last refill was 12/08/2018 # 150 for a 30 day supply.

## 2019-01-15 ENCOUNTER — Telehealth: Payer: Self-pay

## 2019-01-15 ENCOUNTER — Ambulatory Visit: Payer: Commercial Managed Care - PPO | Admitting: Family Medicine
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
## 2019-01-15 NOTE — Telephone Encounter (Signed)
Patient was a no call/no show for their appointment today.   

## 2019-02-12 ENCOUNTER — Other Ambulatory Visit: Payer: Self-pay | Admitting: Neurology

## 2019-02-12 NOTE — Telephone Encounter (Signed)
Reviewed pt chart. She was last seen 09/17/2018 and no showed her follow up on 01/15/19. She has no pending appt. Checked drug registry. She last refilled tramadol 01/13/2019 #150.   She received clonazepam 0.5mg  tablet on 01/24/19 #30 from Annita Brod, FNP

## 2019-03-09 ENCOUNTER — Other Ambulatory Visit: Payer: Self-pay | Admitting: Neurology

## 2019-03-10 NOTE — Telephone Encounter (Signed)
Pt last seen 09/17/2018. Needs f/u prior to refilling tramadol. Called and offered tomorrow at 4pm with Dr. Felecia Shelling. Pt unable to accept. Offered other appt with AL,NP but pt unable to accept. Scheduled appt 03/13/2019 at 2pm with Dr. Felecia Shelling. Advised her to check in no later than 1:45pm. She verbalized understanding.

## 2019-03-13 ENCOUNTER — Ambulatory Visit: Payer: Self-pay | Admitting: Neurology

## 2019-03-17 ENCOUNTER — Encounter: Payer: Self-pay | Admitting: Neurology

## 2019-08-18 ENCOUNTER — Other Ambulatory Visit: Payer: Self-pay | Admitting: Neurology

## 2019-08-19 ENCOUNTER — Other Ambulatory Visit: Payer: Self-pay | Admitting: Neurology

## 2019-11-11 ENCOUNTER — Other Ambulatory Visit: Payer: Self-pay | Admitting: Neurology

## 2019-11-16 ENCOUNTER — Other Ambulatory Visit: Payer: Self-pay | Admitting: Neurology

## 2020-01-15 ENCOUNTER — Other Ambulatory Visit: Payer: Self-pay | Admitting: Neurology

## 2020-03-26 ENCOUNTER — Other Ambulatory Visit: Payer: Self-pay | Admitting: Neurology

## 2021-03-21 ENCOUNTER — Ambulatory Visit: Payer: Commercial Managed Care - PPO | Admitting: Internal Medicine

## 2021-04-21 NOTE — Progress Notes (Deleted)
NEW PATIENT Date of Service/Encounter:  04/21/21 Referring provider: Arlan Organ, MD Primary care provider: Arlan Organ, MD  Subjective:  Ioma Chismar is a 45 y.o. female with a PMHx of hypothyroidism, depression, nephrolithiasis, insomnia presenting today for evaluation of *** History obtained from: chart review and {Persons; PED relatives w/patient:19415::"patient"}.   Most recent AEC 11/29/2020: 200 Chest x-ray 12/16/2019-no evidence of acute cardiac or pulmonary abnormality. Other allergy screening: Asthma: {Blank single:19197::"yes","no"} Rhino conjunctivitis: {Blank single:19197::"yes","no"} Food allergy: {Blank single:19197::"yes","no"} Medication allergy: {Blank single:19197::"yes","no"} Hymenoptera allergy: {Blank single:19197::"yes","no"} Urticaria: {Blank single:19197::"yes","no"} Eczema:{Blank single:19197::"yes","no"} History of recurrent infections suggestive of immunodeficency: {Blank single:19197::"yes","no"} ***Vaccinations are up to date.   Past Medical History: No past medical history on file. Medication List:  Current Outpatient Medications  Medication Sig Dispense Refill   acetaminophen (TYLENOL) 325 MG tablet Take 650 mg by mouth every 6 (six) hours as needed.     citalopram (CELEXA) 20 MG tablet      diclofenac (VOLTAREN) 50 MG EC tablet Take 1 tablet (50 mg total) by mouth 2 (two) times daily. 60 tablet 5   eszopiclone (LUNESTA) 2 MG TABS tablet TAKE 1 TABLET BY MOUTH AT BEDTIME AS NEEDED FOR SLEEP (TAKE  IMMEDIATELY  BEFORE  BEDTME) 30 tablet 5   gabapentin (NEURONTIN) 800 MG tablet Take 1 tablet (800 mg total) by mouth 4 (four) times daily. Take 1 tablet 4 times daily by mouth 120 tablet 11   levothyroxine (SYNTHROID, LEVOTHROID) 175 MCG tablet Take 175 mcg by mouth daily before breakfast.     metFORMIN (GLUCOPHAGE-XR) 500 MG 24 hr tablet Take 1 tablet by mouth daily.     methylPREDNISolone (MEDROL) 4 MG tablet Taper from 6 pills po for one  day to 1 pill po the last day over 6 days 21 tablet 0   tiZANidine (ZANAFLEX) 4 MG tablet TAKE ONE TABLET BY MOUTH UP TO THREE TIMES DAILY 90 tablet 0   traMADol (ULTRAM) 50 MG tablet TAKE ONE TABLET IN THE MORNING, TWO TABLETS IN THE EVENING AND TWO TABLETS AT BEDTIME 150 tablet 0   No current facility-administered medications for this visit.   Known Allergies:  Allergies  Allergen Reactions   Morphine Anaphylaxis   Cephalexin     REACTION: hives   Etodolac     Upset stomach   Propoxyphene N-Acetaminophen     REACTION: nausea, rash   Past Surgical History: No past surgical history on file. Family History: No family history on file. Social History: Kaimana lives ***.   ROS:  All other systems negative except as noted per HPI.  Objective:  There were no vitals taken for this visit. There is no height or weight on file to calculate BMI. Physical Exam:  General Appearance:  Alert, cooperative, no distress, appears stated age  Head:  Normocephalic, without obvious abnormality, atraumatic  Eyes:  Conjunctiva clear, EOM's intact  Nose: Nares normal  Throat: Lips, tongue normal; teeth and gums normal  Neck: Supple, symmetrical  Lungs:   Respirations unlabored, no coughing  Heart:  Appears well perfused  Extremities: No edema  Skin: Skin color, texture, turgor normal, no rashes or lesions on visualized portions of skin  Neurologic: No gross deficits     Diagnostics: Spirometry:  Tracings reviewed. Her effort: {Blank single:19197::"Good reproducible efforts.","It was hard to get consistent efforts and there is a question as to whether this reflects a maximal maneuver.","Poor effort, data can not be interpreted."} FVC: ***L FEV1: ***L, ***% predicted FEV1/FVC ratio: ***% Interpretation: {  Blank single:19197::"Spirometry consistent with mild obstructive disease","Spirometry consistent with moderate obstructive disease","Spirometry consistent with severe obstructive  disease","Spirometry consistent with possible restrictive disease","Spirometry consistent with mixed obstructive and restrictive disease","Spirometry uninterpretable due to technique","Spirometry consistent with normal pattern","No overt abnormalities noted given today's efforts"}.  Please see scanned spirometry results for details.  Skin Testing: {Blank single:19197::"Select foods","Environmental allergy panel","Environmental allergy panel and select foods","Food allergy panel","None","Deferred due to recent antihistamines use"}. Positive test to: ***. Negative test to: ***.  Results discussed with patient/family.   {Blank single:19197::"Allergy testing results were read and interpreted by myself, documented by clinical staff."," "}  Assessment and Plan  There are no Patient Instructions on file for this visit.  No follow-ups on file.  {Blank single:19197::"This note in its entirety was forwarded to the Provider who requested this consultation."}  Thank you for your kind referral. I appreciate the opportunity to take part in Shaquille's care. Please do not hesitate to contact me with questions.***  Sincerely,  Tonny Bollman, MD [facility]

## 2021-04-22 ENCOUNTER — Ambulatory Visit: Payer: Commercial Managed Care - PPO | Admitting: Internal Medicine

## 2021-11-07 ENCOUNTER — Telehealth: Payer: Self-pay | Admitting: *Deleted

## 2021-11-07 NOTE — Telephone Encounter (Signed)
If patient call back [facility].

## 2022-09-23 ENCOUNTER — Other Ambulatory Visit: Payer: Self-pay | Admitting: Neurology

## 2022-09-25 NOTE — Telephone Encounter (Signed)
Last seen on 09/17/18 per note "She takes gabapentin, tizanidine and prn tramadol. " No follow up visit scheduled   Dr.Sater Rx pending for you to deny Rx

## 2022-12-19 ENCOUNTER — Other Ambulatory Visit: Payer: Self-pay | Admitting: Neurosurgery

## 2022-12-25 ENCOUNTER — Encounter (HOSPITAL_COMMUNITY): Payer: Self-pay

## 2022-12-25 NOTE — Progress Notes (Signed)
Surgical Instructions   Your procedure is scheduled on Monday, January 01, 2023. Report to [facility] Main Entrance "A" at 10:56 A.M., then check in with the Admitting office. Any questions or running late day of surgery: call [phone]  Questions prior to your surgery date: call [phone], Monday-Friday, 8am-4pm. If you experience any cold or flu symptoms such as cough, fever, chills, shortness of breath, etc. between now and your scheduled surgery, please notify us at the above number.     Remember:  Do not eat after midnight the night before your surgery  You may drink clear liquids until 9:56 the morning of your surgery.   Clear liquids allowed are: Water, Non-Citrus Juices (without pulp), Carbonated Beverages, Clear Tea, Black Coffee Only (NO MILK, CREAM OR POWDERED CREAMER of any kind), and Gatorade.    Take these medicines the morning of surgery with A SIP OF WATER  gabapentin (NEURONTIN)  levothyroxine (SYNTHROID)  rosuvastatin (CRESTOR)  Vilazodone HCl (VIIBRYD)   May take these medicines IF NEEDED: methocarbamol (ROBAXIN)    One week prior to surgery, STOP taking any Aspirin (unless otherwise instructed by your surgeon) Aleve, Naproxen, Ibuprofen, Motrin, Advil, Goody's, BC's, all herbal medications, fish oil, and non-prescription vitamins.                     Do NOT Smoke (Tobacco/Vaping) for 24 hours prior to your procedure.  If you use a CPAP at night, you may bring your mask/headgear for your overnight stay.   You will be asked to remove any contacts, glasses, piercing's, hearing aid's, dentures/partials prior to surgery. Please bring cases for these items if needed.    Patients discharged the day of surgery will not be allowed to drive home, and someone needs to stay with them for 24 hours.  SURGICAL WAITING ROOM VISITATION Patients may have no more than 2 support people in the waiting area - these visitors may rotate.   Pre-op nurse will coordinate an  appropriate time for 1 ADULT support person, who may not rotate, to accompany patient in pre-op.  Children under the age of 80 must have an adult with them who is not the patient and must remain in the main waiting area with an adult.  If the patient needs to stay at the hospital during part of their recovery, the visitor guidelines for inpatient rooms apply.  Please refer to the [facility] website for the visitor guidelines for any additional information.   If you received a COVID test during your pre-op visit  it is requested that you wear a mask when out in public, stay away from anyone that may not be feeling well and notify your surgeon if you develop symptoms. If you have been in contact with anyone that has tested positive in the last 10 days please notify you surgeon.      Pre-operative 5 CHG Bathing Instructions   You can play a key role in reducing the risk of infection after surgery. Your skin needs to be as free of germs as possible. You can reduce the number of germs on your skin by washing with CHG (chlorhexidine gluconate) soap before surgery. CHG is an antiseptic soap that kills germs and continues to kill germs even after washing.   DO NOT use if you have an allergy to chlorhexidine/CHG or antibacterial soaps. If your skin becomes reddened or irritated, stop using the CHG and notify one of our RNs at [phone].   Please shower with the  CHG soap starting 4 days before surgery using the following schedule:     Please keep in mind the following:  DO NOT shave, including legs and underarms, starting the day of your first shower.   You may shave your face at any point before/day of surgery.  Place clean sheets on your bed the day you start using CHG soap. Use a clean washcloth (not used since being washed) for each shower. DO NOT sleep with pets once you start using the CHG.   CHG Shower Instructions:  If you choose to wash your hair and private area, wash first with  your normal shampoo/soap.  After you use shampoo/soap, rinse your hair and body thoroughly to remove shampoo/soap residue.  Turn the water OFF and apply about 3 tablespoons (45 ml) of CHG soap to a CLEAN washcloth.  Apply CHG soap ONLY FROM YOUR NECK DOWN TO YOUR TOES (washing for 3-5 minutes)  DO NOT use CHG soap on face, private areas, open wounds, or sores.  Pay special attention to the area where your surgery is being performed.  If you are having back surgery, having someone wash your back for you may be helpful. Wait 2 minutes after CHG soap is applied, then you may rinse off the CHG soap.  Pat dry with a clean towel  Put on clean clothes/pajamas   If you choose to wear lotion, please use ONLY the CHG-compatible lotions on the back of this paper.   Additional instructions for the day of surgery: DO NOT APPLY any lotions, deodorants or perfumes.   Do not bring valuables to the hospital. [facility] is not responsible for any belongings/valuables. Do not wear nail polish, gel polish, artificial nails, or any other type of covering on natural nails (fingers and toes) Do not wear jewelry or makeup Put on clean/comfortable clothes.  Please brush your teeth.  Ask your nurse before applying any prescription medications to the skin.     CHG Compatible Lotions   Aveeno Moisturizing lotion  Cetaphil Moisturizing Cream  Cetaphil Moisturizing Lotion  Clairol Herbal Essence Moisturizing Lotion, Dry Skin  Clairol Herbal Essence Moisturizing Lotion, Extra Dry Skin  Clairol Herbal Essence Moisturizing Lotion, Normal Skin  Curel Age Defying Therapeutic Moisturizing Lotion with Alpha Hydroxy  Curel Extreme Care Body Lotion  Curel Soothing Hands Moisturizing Hand Lotion  Curel Therapeutic Moisturizing Cream, Fragrance-Free  Curel Therapeutic Moisturizing Lotion, Fragrance-Free  Curel Therapeutic Moisturizing Lotion, Original Formula  Eucerin Daily Replenishing Lotion  Eucerin Dry Skin  Therapy Plus Alpha Hydroxy Crme  Eucerin Dry Skin Therapy Plus Alpha Hydroxy Lotion  Eucerin Original Crme  Eucerin Original Lotion  Eucerin Plus Crme Eucerin Plus Lotion  Eucerin TriLipid Replenishing Lotion  Keri Anti-Bacterial Hand Lotion  Keri Deep Conditioning Original Lotion Dry Skin Formula Softly Scented  Keri Deep Conditioning Original Lotion, Fragrance Free Sensitive Skin Formula  Keri Lotion Fast Absorbing Fragrance Free Sensitive Skin Formula  Keri Lotion Fast Absorbing Softly Scented Dry Skin Formula  Keri Original Lotion  Keri Skin Renewal Lotion Keri Silky Smooth Lotion  Keri Silky Smooth Sensitive Skin Lotion  Nivea Body Creamy Conditioning Oil  Nivea Body Extra Enriched Lotion  Nivea Body Original Lotion  Nivea Body Sheer Moisturizing Lotion Nivea Crme  Nivea Skin Firming Lotion  NutraDerm 30 Skin Lotion  NutraDerm Skin Lotion  NutraDerm Therapeutic Skin Cream  NutraDerm Therapeutic Skin Lotion  ProShield Protective Hand Cream  Provon moisturizing lotion  Please read over the following fact sheets that you were  given.

## 2022-12-26 ENCOUNTER — Encounter (HOSPITAL_COMMUNITY): Payer: Self-pay

## 2022-12-26 ENCOUNTER — Other Ambulatory Visit: Payer: Self-pay

## 2022-12-26 ENCOUNTER — Encounter (HOSPITAL_COMMUNITY)
Admission: RE | Admit: 2022-12-26 | Discharge: 2022-12-26 | Disposition: A | Discharge status: Home / Self Care | Payer: Medicaid Other | Source: Ambulatory Visit | Attending: Neurosurgery | Admitting: Neurosurgery

## 2022-12-26 VITALS — BP 145/81 | HR 72 | Temp 98.4°F | Resp 18 | Ht 66.0 in | Wt 158.2 lb

## 2022-12-26 DIAGNOSIS — M5412 Radiculopathy, cervical region: Secondary | ICD-10-CM

## 2022-12-26 DIAGNOSIS — Z01818 Encounter for other preprocedural examination: Secondary | ICD-10-CM | POA: Diagnosis present

## 2022-12-26 DIAGNOSIS — M5416 Radiculopathy, lumbar region: Secondary | ICD-10-CM | POA: Insufficient documentation

## 2022-12-26 DIAGNOSIS — Z01812 Encounter for preprocedural laboratory examination: Secondary | ICD-10-CM | POA: Diagnosis not present

## 2022-12-26 HISTORY — DX: Headache, unspecified: R51.9

## 2022-12-26 HISTORY — DX: Personal history of urinary calculi: Z87.442

## 2022-12-26 HISTORY — DX: Pure hypercholesterolemia, unspecified: E78.00

## 2022-12-26 HISTORY — DX: Depression, unspecified: F32.A

## 2022-12-26 HISTORY — DX: Unspecified osteoarthritis, unspecified site: M19.90

## 2022-12-26 HISTORY — DX: Essential (primary) hypertension: I10

## 2022-12-26 HISTORY — DX: Other psychoactive substance abuse, uncomplicated: F19.10

## 2022-12-26 HISTORY — DX: Hypothyroidism, unspecified: E03.9

## 2022-12-26 HISTORY — DX: Other cervical disc degeneration, unspecified cervical region: M50.30

## 2022-12-26 HISTORY — DX: Unspecified psychosis not due to a substance or known physiological condition: F29

## 2022-12-26 LAB — BASIC METABOLIC PANEL
Anion gap: 7 (ref 5–15)
BUN: 7 mg/dL (ref 6–20)
CO2: 28 mmol/L (ref 22–32)
Calcium: 9.2 mg/dL (ref 8.9–10.3)
Chloride: 104 mmol/L (ref 98–111)
Creatinine, Ser: 0.74 mg/dL (ref 0.44–1.00)
GFR, Estimated: >60 mL/min (ref >60)
Glucose, Bld: 107 mg/dL — ABNORMAL HIGH (ref 70–99)
Potassium: 4.1 mmol/L (ref 3.5–5.1)
Sodium: 139 mmol/L (ref 135–145)

## 2022-12-26 LAB — CBC
HCT: 41.5 % (ref 36.0–46.0)
Hemoglobin: 13.5 g/dL (ref 12.0–15.0)
MCH: 31.4 pg (ref 26.0–34.0)
MCHC: 32.5 g/dL (ref 30.0–36.0)
MCV: 96.5 fL (ref 80.0–100.0)
Platelets: 375 10*3/uL (ref 150–400)
RBC: 4.3 MIL/uL (ref 3.87–5.11)
RDW: 11.9 % (ref 11.5–15.5)
WBC: 6.7 10*3/uL (ref 4.0–10.5)
nRBC: 0 % (ref 0.0–0.2)

## 2022-12-26 LAB — TYPE AND SCREEN
ABO/RH(D): B POS
Antibody Screen: NEGATIVE

## 2022-12-26 NOTE — Progress Notes (Addendum)
PCP - Vertell Limber, NP  Cardiologist - no  EP-no  Endocrine-no  Pulm-no  Chest x -ray -   EKG - na  Stress Test - no  ECHO - no  Cardiac Cath - no  AICD-no PM-no LOOP-no  Nerve Stimulator-no  Dialysis-no  Sleep Study - no CPAP - no  LABS-CBC, T/S, BMP  ASA-no  ERAS-yes  GLP-1 no Fasting Blood Sugar - na Checks Blood Sugar __na___ times a day  Anesthesia-  Ms Penner' blood pressure was  145/81 on arrival at PAT, patient states that blood pressure is not normally that high, blood pressure was 104/82 at PCP's appointment on 10/25/22.   Pt denies having chest pain, sob, or fever at this time. All instructions explained to the pt, with a verbal understanding of the material. Pt agrees to go over the instructions while at home for a better understanding. The opportunity to ask questions was provided.   I was going over instructions, Ms Sauer took the papers from me-patient said, "I am not going use the soap , I use antibacteria soap all of the time."  Ms Whittaker then said what else do we have to do, I have to pick my brace at 3 pm,  ( time was 1355). I informed patient that we need to draw blood and I need to do a nasal swab to check or MRSA- patient said, "you are not swabbing my nose."  I notified Vanessa at Dr. Lindalou Hose office of patients refusal of PCR and that patient said that she is not using the CHG.  After signing OR Consent, Ms. Albarran held her phone up and was going to take a picture, I told patient that she will have to speak with Medical after surgery, then she can see the records. Patient said , I do not under stand it is my surgery, I informed patient that it is hospital property and she has to follow the hospital policy.  Ms. Knotek said that she has a case and that her representative needs the information, "I"ll have them to get the records."

## 2023-01-01 ENCOUNTER — Inpatient Hospital Stay (HOSPITAL_COMMUNITY): Admission: RE | Admit: 2023-01-01 | Payer: Medicaid Other | Source: Home / Self Care | Admitting: Neurosurgery

## 2023-01-01 ENCOUNTER — Encounter (HOSPITAL_COMMUNITY): Admission: RE | Payer: Self-pay | Source: Home / Self Care

## 2023-01-01 DIAGNOSIS — Z01818 Encounter for other preprocedural examination: Secondary | ICD-10-CM

## 2023-01-01 DIAGNOSIS — M5416 Radiculopathy, lumbar region: Secondary | ICD-10-CM

## 2023-01-01 SURGERY — ANTERIOR CERVICAL DECOMPRESSION/DISCECTOMY FUSION 3 LEVELS
Anesthesia: General

## 2023-05-17 ENCOUNTER — Other Ambulatory Visit: Payer: Self-pay | Admitting: Neurosurgery

## 2023-05-28 NOTE — Progress Notes (Signed)
 Surgical Instructions   Your procedure is scheduled on June 04, 2023. Report to [facility] Main Entrance A at 10:15 A.M., then check in with the Admitting office. Any questions or running late day of surgery: call [phone]  Questions prior to your surgery date: call [phone], Monday-Friday, 8am-4pm. If you experience any cold or flu symptoms such as cough, fever, chills, shortness of breath, etc. between now and your scheduled surgery, please notify us  at the above number.     Remember:  Do not eat after midnight the night before your surgery  You may drink clear liquids until 9:15 the morning of your surgery.   Clear liquids allowed are: Water, Non-Citrus Juices (without pulp), Carbonated Beverages, Clear Tea (no milk, honey, etc.), Black Coffee Only (NO MILK, CREAM OR POWDERED CREAMER of any kind), and Gatorade.    Take these medicines the morning of surgery with A SIP OF WATER  gabapentin  (NEURONTIN )  levothyroxine  (SYNTHROID )  Vilazodone  HCl (VIIBRYD )   May take these medicines IF NEEDED: tiZANidine  (ZANAFLEX )    One week prior to surgery, STOP taking any Aspirin (unless otherwise instructed by your surgeon) Aleve, Naproxen, Ibuprofen, Motrin, Advil, Goody's, BC's, all herbal medications, fish oil, and non-prescription vitamins.                     Do NOT Smoke (Tobacco/Vaping) for 24 hours prior to your procedure.  If you use a CPAP at night, you may bring your mask/headgear for your overnight stay.   You will be asked to remove any contacts, glasses, piercing's, hearing aid's, dentures/partials prior to surgery. Please bring cases for these items if needed.    Patients discharged the day of surgery will not be allowed to drive home, and someone needs to stay with them for 24 hours.  SURGICAL WAITING ROOM VISITATION Patients may have no more than 2 support people in the waiting area - these visitors may rotate.   Pre-op nurse will coordinate an appropriate  time for 1 ADULT support person, who may not rotate, to accompany patient in pre-op.  Children under the age of 51 must have an adult with them who is not the patient and must remain in the main waiting area with an adult.  If the patient needs to stay at the hospital during part of their recovery, the visitor guidelines for inpatient rooms apply.  Please refer to the [facility] website for the visitor guidelines for any additional information.   If you received a COVID test during your pre-op visit  it is requested that you wear a mask when out in public, stay away from anyone that may not be feeling well and notify your surgeon if you develop symptoms. If you have been in contact with anyone that has tested positive in the last 10 days please notify you surgeon.      Pre-operative 5 CHG Bathing Instructions   You can play a key role in reducing the risk of infection after surgery. Your skin needs to be as free of germs as possible. You can reduce the number of germs on your skin by washing with CHG (chlorhexidine  gluconate) soap before surgery. CHG is an antiseptic soap that kills germs and continues to kill germs even after washing.   DO NOT use if you have an allergy to chlorhexidine /CHG or antibacterial soaps. If your skin becomes reddened or irritated, stop using the CHG and notify one of our RNs at [phone].   Please shower with the  CHG soap starting 4 days before surgery using the following schedule:     Please keep in mind the following:  DO NOT shave, including legs and underarms, starting the day of your first shower.   You may shave your face at any point before/day of surgery.  Place clean sheets on your bed the day you start using CHG soap. Use a clean washcloth (not used since being washed) for each shower. DO NOT sleep with pets once you start using the CHG.   CHG Shower Instructions:  Wash your face and private area with normal soap. If you choose to wash your  hair, wash first with your normal shampoo.  After you use shampoo/soap, rinse your hair and body thoroughly to remove shampoo/soap residue.  Turn the water OFF and apply about 3 tablespoons (45 ml) of CHG soap to a CLEAN washcloth.  Apply CHG soap ONLY FROM YOUR NECK DOWN TO YOUR TOES (washing for 3-5 minutes)  DO NOT use CHG soap on face, private areas, open wounds, or sores.  Pay special attention to the area where your surgery is being performed.  If you are having back surgery, having someone wash your back for you may be helpful. Wait 2 minutes after CHG soap is applied, then you may rinse off the CHG soap.  Pat dry with a clean towel  Put on clean clothes/pajamas   If you choose to wear lotion, please use ONLY the CHG-compatible lotions that are listed below.  Additional instructions for the day of surgery: DO NOT APPLY any lotions, deodorants, cologne, or perfumes.   Do not bring valuables to the hospital. [facility] is not responsible for any belongings/valuables. Do not wear nail polish, gel polish, artificial nails, or any other type of covering on natural nails (fingers and toes) Do not wear jewelry or makeup Put on clean/comfortable clothes.  Please brush your teeth.  Ask your nurse before applying any prescription medications to the skin.     CHG Compatible Lotions   Aveeno Moisturizing lotion  Cetaphil Moisturizing Cream  Cetaphil Moisturizing Lotion  Clairol Herbal Essence Moisturizing Lotion, Dry Skin  Clairol Herbal Essence Moisturizing Lotion, Extra Dry Skin  Clairol Herbal Essence Moisturizing Lotion, Normal Skin  Curel Age Defying Therapeutic Moisturizing Lotion with Alpha Hydroxy  Curel Extreme Care Body Lotion  Curel Soothing Hands Moisturizing Hand Lotion  Curel Therapeutic Moisturizing Cream, Fragrance-Free  Curel Therapeutic Moisturizing Lotion, Fragrance-Free  Curel Therapeutic Moisturizing Lotion, Original Formula  Eucerin Daily Replenishing Lotion   Eucerin Dry Skin Therapy Plus Alpha Hydroxy Crme  Eucerin Dry Skin Therapy Plus Alpha Hydroxy Lotion  Eucerin Original Crme  Eucerin Original Lotion  Eucerin Plus Crme Eucerin Plus Lotion  Eucerin TriLipid Replenishing Lotion  Keri Anti-Bacterial Hand Lotion  Keri Deep Conditioning Original Lotion Dry Skin Formula Softly Scented  Keri Deep Conditioning Original Lotion, Fragrance Free Sensitive Skin Formula  Keri Lotion Fast Absorbing Fragrance Free Sensitive Skin Formula  Keri Lotion Fast Absorbing Softly Scented Dry Skin Formula  Keri Original Lotion  Keri Skin Renewal Lotion Keri Silky Smooth Lotion  Keri Silky Smooth Sensitive Skin Lotion  Nivea Body Creamy Conditioning Oil  Nivea Body Extra Enriched Teacher, Adult Education Moisturizing Lotion Nivea Crme  Nivea Skin Firming Lotion  NutraDerm 30 Skin Lotion  NutraDerm Skin Lotion  NutraDerm Therapeutic Skin Cream  NutraDerm Therapeutic Skin Lotion  ProShield Protective Hand Cream  Provon moisturizing lotion  Please read over the following fact  sheets that you were given.

## 2023-05-29 ENCOUNTER — Encounter (HOSPITAL_COMMUNITY): Payer: Self-pay

## 2023-05-29 ENCOUNTER — Other Ambulatory Visit: Payer: Self-pay

## 2023-05-29 ENCOUNTER — Encounter (HOSPITAL_COMMUNITY)
Admission: RE | Admit: 2023-05-29 | Discharge: 2023-05-29 | Disposition: A | Discharge status: Home / Self Care | Payer: Medicaid Other | Source: Ambulatory Visit | Attending: Neurosurgery | Admitting: Neurosurgery

## 2023-05-29 VITALS — BP 131/87 | HR 90 | Temp 98.4°F | Resp 18 | Ht 66.0 in | Wt 151.1 lb

## 2023-05-29 DIAGNOSIS — R9431 Abnormal electrocardiogram [ECG] [EKG]: Secondary | ICD-10-CM | POA: Diagnosis not present

## 2023-05-29 DIAGNOSIS — Z01818 Encounter for other preprocedural examination: Secondary | ICD-10-CM | POA: Diagnosis not present

## 2023-05-29 DIAGNOSIS — Z01810 Encounter for preprocedural cardiovascular examination: Secondary | ICD-10-CM | POA: Diagnosis present

## 2023-05-29 DIAGNOSIS — Z01812 Encounter for preprocedural laboratory examination: Secondary | ICD-10-CM | POA: Diagnosis present

## 2023-05-29 LAB — CBC
HCT: 38.4 % (ref 36.0–46.0)
Hemoglobin: 13.1 g/dL (ref 12.0–15.0)
MCH: 31.8 pg (ref 26.0–34.0)
MCHC: 34.1 g/dL (ref 30.0–36.0)
MCV: 93.2 fL (ref 80.0–100.0)
Platelets: 368 10*3/uL (ref 150–400)
RBC: 4.12 MIL/uL (ref 3.87–5.11)
RDW: 11.8 % (ref 11.5–15.5)
WBC: 6.7 10*3/uL (ref 4.0–10.5)
nRBC: 0 % (ref 0.0–0.2)

## 2023-05-29 LAB — SURGICAL PCR SCREEN
MRSA, PCR: NEGATIVE
Staphylococcus aureus: NEGATIVE

## 2023-05-29 LAB — TYPE AND SCREEN
ABO/RH(D): B POS
Antibody Screen: NEGATIVE

## 2023-05-29 LAB — BASIC METABOLIC PANEL
Anion gap: 9 (ref 5–15)
BUN: 12 mg/dL (ref 6–20)
CO2: 24 mmol/L (ref 22–32)
Calcium: 9.8 mg/dL (ref 8.9–10.3)
Chloride: 106 mmol/L (ref 98–111)
Creatinine, Ser: 0.68 mg/dL (ref 0.44–1.00)
GFR, Estimated: >60 mL/min (ref >60)
Glucose, Bld: 116 mg/dL — ABNORMAL HIGH (ref 70–99)
Potassium: 3.7 mmol/L (ref 3.5–5.1)
Sodium: 139 mmol/L (ref 135–145)

## 2023-05-29 NOTE — Progress Notes (Signed)
 PCP - [place] Cardiologist - denies  PPM/ICD - denies Device Orders - n/a Rep Notified - n/a  Chest x-ray - denies EKG - denies Stress Test - denies ECHO - denies Cardiac Cath - denies  Sleep Study - denies CPAP -   DM - denies  Blood Thinner Instructions: denies Aspirin Instructions:  ERAS Protcol - Clear liquids until 9:15   COVID TEST- n/a   Anesthesia review: no  Patient denies shortness of breath, fever, cough and chest pain at PAT appointment   All instructions explained to the patient, with a verbal understanding of the material. Patient agrees to go over the instructions while at home for a better understanding. Patient also instructed to self quarantine after being tested for COVID-19. The opportunity to ask questions was provided.

## 2023-06-01 NOTE — Progress Notes (Signed)
 patient voiced understanding of new arrival time of 0600 on monday and clear liquids okay until 0500

## 2023-06-03 NOTE — Anesthesia Preprocedure Evaluation (Addendum)
 Anesthesia Evaluation  Patient identified by MRN, date of birth, ID band Patient awake    Reviewed: Allergy & Precautions, NPO status , Patient's Chart, lab work & pertinent test results  Airway Mallampati: II  TM Distance: >3 FB Neck ROM: Limited    Dental  (+) Edentulous Upper, Edentulous Lower   Pulmonary former smoker   Pulmonary exam normal breath sounds clear to auscultation       Cardiovascular hypertension, Pt. on medications Normal cardiovascular exam Rhythm:Regular Rate:Normal     Neuro/Psych  Headaches PSYCHIATRIC DISORDERS  Depression       GI/Hepatic negative GI ROS, Neg liver ROS,neg GERD  ,,  Endo/Other  Hypothyroidism    Renal/GU negative Renal ROS     Musculoskeletal  (+) Arthritis ,    Abdominal   Peds  Hematology negative hematology ROS (+)   Anesthesia Other Findings   Reproductive/Obstetrics                             Anesthesia Physical Anesthesia Plan  ASA: 2  Anesthesia Plan: General   Post-op Pain Management: Tylenol  PO (pre-op)* and Gabapentin  PO (pre-op)*   Induction: Intravenous  PONV Risk Score and Plan: 4 or greater and Ondansetron , Dexamethasone , Treatment may vary due to age or medical condition and Midazolam   Airway Management Planned: Oral ETT and Video Laryngoscope Planned  Additional Equipment: ClearSight  Intra-op Plan:   Post-operative Plan: Extubation in OR  Informed Consent: I have reviewed the patients History and Physical, chart, labs and discussed the procedure including the risks, benefits and alternatives for the proposed anesthesia with the patient or authorized representative who has indicated his/her understanding and acceptance.     Dental advisory given  Plan Discussed with: CRNA  Anesthesia Plan Comments:         Anesthesia Quick Evaluation

## 2023-06-04 ENCOUNTER — Inpatient Hospital Stay (HOSPITAL_COMMUNITY): Payer: Self-pay | Admitting: Anesthesiology

## 2023-06-04 ENCOUNTER — Inpatient Hospital Stay (HOSPITAL_COMMUNITY)
Admission: RE | Admit: 2023-06-04 | Discharge: 2023-06-05 | DRG: 472 | Disposition: A | Discharge status: Home / Self Care | Payer: Medicaid Other | Attending: Neurosurgery | Admitting: Neurosurgery

## 2023-06-04 ENCOUNTER — Other Ambulatory Visit: Payer: Self-pay

## 2023-06-04 ENCOUNTER — Inpatient Hospital Stay (HOSPITAL_COMMUNITY): Payer: Medicaid Other

## 2023-06-04 ENCOUNTER — Encounter (HOSPITAL_COMMUNITY)
Admission: RE | Disposition: A | Discharge status: Home / Self Care | Payer: Self-pay | Source: Home / Self Care | Attending: Neurosurgery

## 2023-06-04 ENCOUNTER — Encounter (HOSPITAL_COMMUNITY): Payer: Self-pay | Admitting: Neurosurgery

## 2023-06-04 DIAGNOSIS — M4722 Other spondylosis with radiculopathy, cervical region: Secondary | ICD-10-CM | POA: Diagnosis present

## 2023-06-04 DIAGNOSIS — M4802 Spinal stenosis, cervical region: Secondary | ICD-10-CM | POA: Diagnosis present

## 2023-06-04 DIAGNOSIS — M4712 Other spondylosis with myelopathy, cervical region: Secondary | ICD-10-CM | POA: Diagnosis present

## 2023-06-04 DIAGNOSIS — Z79899 Other long term (current) drug therapy: Secondary | ICD-10-CM

## 2023-06-04 DIAGNOSIS — M47812 Spondylosis without myelopathy or radiculopathy, cervical region: Secondary | ICD-10-CM

## 2023-06-04 DIAGNOSIS — E7800 Pure hypercholesterolemia, unspecified: Secondary | ICD-10-CM | POA: Diagnosis present

## 2023-06-04 DIAGNOSIS — Z886 Allergy status to analgesic agent status: Secondary | ICD-10-CM | POA: Diagnosis not present

## 2023-06-04 DIAGNOSIS — Z87891 Personal history of nicotine dependence: Secondary | ICD-10-CM | POA: Diagnosis not present

## 2023-06-04 DIAGNOSIS — F32A Depression, unspecified: Secondary | ICD-10-CM | POA: Diagnosis present

## 2023-06-04 DIAGNOSIS — M5010 Cervical disc disorder with radiculopathy, unspecified cervical region: Principal | ICD-10-CM | POA: Diagnosis present

## 2023-06-04 DIAGNOSIS — Z881 Allergy status to other antibiotic agents status: Secondary | ICD-10-CM | POA: Diagnosis not present

## 2023-06-04 DIAGNOSIS — Z79890 Hormone replacement therapy (postmenopausal): Secondary | ICD-10-CM

## 2023-06-04 DIAGNOSIS — Z885 Allergy status to narcotic agent status: Secondary | ICD-10-CM

## 2023-06-04 DIAGNOSIS — I10 Essential (primary) hypertension: Secondary | ICD-10-CM | POA: Diagnosis present

## 2023-06-04 DIAGNOSIS — E039 Hypothyroidism, unspecified: Secondary | ICD-10-CM | POA: Diagnosis present

## 2023-06-04 HISTORY — PX: ANTERIOR CERVICAL DECOMP/DISCECTOMY FUSION: SHX1161

## 2023-06-04 LAB — POCT PREGNANCY, URINE
Preg Test, Ur: NEGATIVE
Preg Test, Ur: NEGATIVE

## 2023-06-04 SURGERY — ANTERIOR CERVICAL DECOMPRESSION/DISCECTOMY FUSION 3 LEVELS
Anesthesia: General | Site: Spine Cervical

## 2023-06-04 MED ORDER — SENNA 8.6 MG PO TABS
1.0000 | ORAL_TABLET | Freq: Two times a day (BID) | ORAL | Status: DC | PRN
  Administered 2023-06-04: 8.6 mg via ORAL
  Filled 2023-06-04: qty 1

## 2023-06-04 MED ORDER — ACETAMINOPHEN 500 MG PO TABS
1000.0000 mg | ORAL_TABLET | Freq: Once | ORAL | Status: AC
  Administered 2023-06-04: 1000 mg via ORAL

## 2023-06-04 MED ORDER — PROPOFOL 10 MG/ML IV BOLUS
INTRAVENOUS | Status: AC
  Filled 2023-06-04: qty 20

## 2023-06-04 MED ORDER — THROMBIN 5000 UNITS EX SOLR
OROMUCOSAL | Status: DC | PRN
  Administered 2023-06-04: 5 mL via TOPICAL

## 2023-06-04 MED ORDER — VANCOMYCIN HCL IN DEXTROSE 1-5 GM/200ML-% IV SOLN
1000.0000 mg | Freq: Once | INTRAVENOUS | Status: AC
  Administered 2023-06-04: 1000 mg via INTRAVENOUS
  Filled 2023-06-04: qty 200

## 2023-06-04 MED ORDER — ACETAMINOPHEN 325 MG PO TABS: 650.0000 mg | ORAL_TABLET | ORAL | Status: DC | PRN

## 2023-06-04 MED ORDER — OXYCODONE HCL 5 MG PO TABS: 5.0000 mg | ORAL_TABLET | Freq: Once | ORAL | Status: DC | PRN

## 2023-06-04 MED ORDER — CYCLOBENZAPRINE HCL 10 MG PO TABS
10.0000 mg | ORAL_TABLET | Freq: Three times a day (TID) | ORAL | Status: DC | PRN
  Administered 2023-06-04: 10 mg via ORAL
  Filled 2023-06-04: qty 1

## 2023-06-04 MED ORDER — TIZANIDINE HCL 4 MG PO TABS
4.0000 mg | ORAL_TABLET | Freq: Three times a day (TID) | ORAL | Status: DC | PRN
  Administered 2023-06-04 – 2023-06-05 (×3): 4 mg via ORAL
  Filled 2023-06-04 (×3): qty 1

## 2023-06-04 MED ORDER — ROCURONIUM BROMIDE 10 MG/ML (PF) SYRINGE
PREFILLED_SYRINGE | INTRAVENOUS | Status: DC | PRN
  Administered 2023-06-04 (×2): 10 mg via INTRAVENOUS
  Administered 2023-06-04: 60 mg via INTRAVENOUS

## 2023-06-04 MED ORDER — ORAL CARE MOUTH RINSE: 15.0000 mL | Freq: Once | OROMUCOSAL | Status: AC

## 2023-06-04 MED ORDER — SODIUM CHLORIDE 0.9 % IV SOLN
250.0000 mL | INTRAVENOUS | Status: AC
  Administered 2023-06-04: 250 mL via INTRAVENOUS

## 2023-06-04 MED ORDER — DEXMEDETOMIDINE HCL IN NACL 80 MCG/20ML IV SOLN
INTRAVENOUS | Status: DC | PRN
  Administered 2023-06-04: 12 ug via INTRAVENOUS
  Administered 2023-06-04: 8 ug via INTRAVENOUS

## 2023-06-04 MED ORDER — PHENOL 1.4 % MT LIQD: 1.0000 | OROMUCOSAL | Status: DC | PRN

## 2023-06-04 MED ORDER — DOCUSATE SODIUM 100 MG PO CAPS
100.0000 mg | ORAL_CAPSULE | Freq: Two times a day (BID) | ORAL | Status: DC | PRN
  Administered 2023-06-04: 100 mg via ORAL
  Filled 2023-06-04: qty 1

## 2023-06-04 MED ORDER — HYDROMORPHONE HCL 1 MG/ML IJ SOLN
0.2500 mg | INTRAMUSCULAR | Status: DC | PRN
  Administered 2023-06-04 (×2): 0.5 mg via INTRAVENOUS

## 2023-06-04 MED ORDER — CEFAZOLIN SODIUM-DEXTROSE 2-4 GM/100ML-% IV SOLN: 2.0000 g | INTRAVENOUS | Status: DC

## 2023-06-04 MED ORDER — THROMBIN 20000 UNITS EX SOLR
CUTANEOUS | Status: DC | PRN
  Administered 2023-06-04: 20 mL via TOPICAL

## 2023-06-04 MED ORDER — ONDANSETRON HCL 4 MG PO TABS: 4.0000 mg | ORAL_TABLET | Freq: Four times a day (QID) | ORAL | Status: DC | PRN

## 2023-06-04 MED ORDER — CHLORHEXIDINE GLUCONATE 0.12 % MT SOLN
15.0000 mL | Freq: Once | OROMUCOSAL | Status: AC
  Administered 2023-06-04: 15 mL via OROMUCOSAL
  Filled 2023-06-04: qty 15

## 2023-06-04 MED ORDER — ONDANSETRON HCL 4 MG/2ML IJ SOLN
INTRAMUSCULAR | Status: DC | PRN
  Administered 2023-06-04: 4 mg via INTRAVENOUS

## 2023-06-04 MED ORDER — HYDROCODONE-ACETAMINOPHEN 10-325 MG PO TABS
1.0000 | ORAL_TABLET | ORAL | Status: DC | PRN
  Administered 2023-06-04: 2 via ORAL
  Filled 2023-06-04: qty 2

## 2023-06-04 MED ORDER — DEXAMETHASONE SODIUM PHOSPHATE 10 MG/ML IJ SOLN
INTRAMUSCULAR | Status: DC | PRN
  Administered 2023-06-04: 10 mg via INTRAVENOUS

## 2023-06-04 MED ORDER — FENTANYL CITRATE (PF) 250 MCG/5ML IJ SOLN
INTRAMUSCULAR | Status: AC
  Filled 2023-06-04: qty 5

## 2023-06-04 MED ORDER — HYDROCODONE-ACETAMINOPHEN 10-325 MG PO TABS
1.0000 | ORAL_TABLET | ORAL | Status: DC | PRN
Start: 2023-06-04 — End: 2023-06-05
  Administered 2023-06-04 – 2023-06-05 (×4): 2 via ORAL
  Filled 2023-06-04 (×4): qty 2

## 2023-06-04 MED ORDER — SODIUM CHLORIDE 0.9% FLUSH: 3.0000 mL | INTRAVENOUS | Status: DC | PRN

## 2023-06-04 MED ORDER — HYDROMORPHONE HCL 1 MG/ML IJ SOLN
INTRAMUSCULAR | Status: AC
  Filled 2023-06-04: qty 1

## 2023-06-04 MED ORDER — 0.9 % SODIUM CHLORIDE (POUR BTL) OPTIME
TOPICAL | Status: DC | PRN
  Administered 2023-06-04: 1000 mL

## 2023-06-04 MED ORDER — GABAPENTIN 400 MG PO CAPS
800.0000 mg | ORAL_CAPSULE | Freq: Three times a day (TID) | ORAL | Status: DC
  Administered 2023-06-04 – 2023-06-05 (×3): 800 mg via ORAL
  Filled 2023-06-04 (×3): qty 2

## 2023-06-04 MED ORDER — VANCOMYCIN HCL IN DEXTROSE 1-5 GM/200ML-% IV SOLN
INTRAVENOUS | Status: AC
  Filled 2023-06-04: qty 200

## 2023-06-04 MED ORDER — DROPERIDOL 2.5 MG/ML IJ SOLN: 0.6250 mg | Freq: Once | INTRAMUSCULAR | Status: DC | PRN

## 2023-06-04 MED ORDER — SODIUM CHLORIDE 0.9% FLUSH
3.0000 mL | Freq: Two times a day (BID) | INTRAVENOUS | Status: DC
  Administered 2023-06-04: 3 mL via INTRAVENOUS

## 2023-06-04 MED ORDER — THROMBIN 5000 UNITS EX SOLR
CUTANEOUS | Status: AC
  Filled 2023-06-04: qty 5000

## 2023-06-04 MED ORDER — OXYCODONE HCL 5 MG/5ML PO SOLN: 5.0000 mg | Freq: Once | ORAL | Status: DC | PRN

## 2023-06-04 MED ORDER — VANCOMYCIN HCL IN DEXTROSE 1-5 GM/200ML-% IV SOLN
1000.0000 mg | Freq: Once | INTRAVENOUS | Status: AC
  Administered 2023-06-04: 1000 mg via INTRAVENOUS

## 2023-06-04 MED ORDER — CHLORHEXIDINE GLUCONATE CLOTH 2 % EX PADS: 6.0000 | MEDICATED_PAD | Freq: Once | CUTANEOUS | Status: DC

## 2023-06-04 MED ORDER — PHENYLEPHRINE 80 MCG/ML (10ML) SYRINGE FOR IV PUSH (FOR BLOOD PRESSURE SUPPORT)
PREFILLED_SYRINGE | INTRAVENOUS | Status: DC | PRN
  Administered 2023-06-04 (×4): 80 ug via INTRAVENOUS
  Administered 2023-06-04: 120 ug via INTRAVENOUS

## 2023-06-04 MED ORDER — MIDAZOLAM HCL 2 MG/2ML IJ SOLN
INTRAMUSCULAR | Status: DC | PRN
  Administered 2023-06-04: 2 mg via INTRAVENOUS

## 2023-06-04 MED ORDER — LIDOCAINE 2% (20 MG/ML) 5 ML SYRINGE
INTRAMUSCULAR | Status: DC | PRN
  Administered 2023-06-04: 60 mg via INTRAVENOUS

## 2023-06-04 MED ORDER — HYDROMORPHONE HCL 1 MG/ML IJ SOLN
1.0000 mg | INTRAMUSCULAR | Status: DC | PRN
  Administered 2023-06-04 – 2023-06-05 (×3): 1 mg via INTRAVENOUS
  Filled 2023-06-04 (×3): qty 1

## 2023-06-04 MED ORDER — GABAPENTIN 300 MG PO CAPS
300.0000 mg | ORAL_CAPSULE | Freq: Once | ORAL | Status: AC
  Administered 2023-06-04: 300 mg via ORAL
  Filled 2023-06-04: qty 1

## 2023-06-04 MED ORDER — VILAZODONE HCL 20 MG PO TABS
20.0000 mg | ORAL_TABLET | Freq: Every day | ORAL | Status: DC
  Filled 2023-06-04 (×2): qty 1

## 2023-06-04 MED ORDER — MENTHOL 3 MG MT LOZG: 1.0000 | LOZENGE | OROMUCOSAL | Status: DC | PRN

## 2023-06-04 MED ORDER — PROPOFOL 10 MG/ML IV BOLUS
INTRAVENOUS | Status: DC | PRN
  Administered 2023-06-04: 140 mg via INTRAVENOUS

## 2023-06-04 MED ORDER — SUGAMMADEX SODIUM 200 MG/2ML IV SOLN
INTRAVENOUS | Status: DC | PRN
  Administered 2023-06-04: 200 mg via INTRAVENOUS

## 2023-06-04 MED ORDER — HYDROCODONE-ACETAMINOPHEN 5-325 MG PO TABS: 1.0000 | ORAL_TABLET | ORAL | Status: DC | PRN

## 2023-06-04 MED ORDER — ACETAMINOPHEN 650 MG RE SUPP: 650.0000 mg | RECTAL | Status: DC | PRN

## 2023-06-04 MED ORDER — LEVOTHYROXINE SODIUM 150 MCG PO TABS
150.0000 ug | ORAL_TABLET | Freq: Every day | ORAL | Status: DC
  Filled 2023-06-04: qty 1

## 2023-06-04 MED ORDER — FENTANYL CITRATE (PF) 250 MCG/5ML IJ SOLN
INTRAMUSCULAR | Status: DC | PRN
  Administered 2023-06-04: 50 ug via INTRAVENOUS
  Administered 2023-06-04: 100 ug via INTRAVENOUS
  Administered 2023-06-04 (×2): 50 ug via INTRAVENOUS

## 2023-06-04 MED ORDER — ONDANSETRON HCL 4 MG/2ML IJ SOLN: 4.0000 mg | Freq: Four times a day (QID) | INTRAMUSCULAR | Status: DC | PRN

## 2023-06-04 MED ORDER — SUCCINYLCHOLINE CHLORIDE 200 MG/10ML IV SOSY: PREFILLED_SYRINGE | INTRAVENOUS | Status: DC | PRN

## 2023-06-04 MED ORDER — THROMBIN 20000 UNITS EX SOLR
CUTANEOUS | Status: AC
  Filled 2023-06-04: qty 20000

## 2023-06-04 MED ORDER — MIDAZOLAM HCL 2 MG/2ML IJ SOLN
INTRAMUSCULAR | Status: AC
  Filled 2023-06-04: qty 2

## 2023-06-04 MED ORDER — LACTATED RINGERS IV SOLN: INTRAVENOUS | Status: DC

## 2023-06-04 SURGICAL SUPPLY — 43 items
BAG COUNTER SPONGE SURGICOUNT (BAG) ×2 IMPLANT
BENZOIN TINCTURE PRP APPL 2/3 (GAUZE/BANDAGES/DRESSINGS) ×2 IMPLANT
BIT DRILL 13 (BIT) IMPLANT
BUR MATCHSTICK NEURO 3.0 LAGG (BURR) ×2 IMPLANT
CANISTER SUCT 3000ML PPV (MISCELLANEOUS) ×2 IMPLANT
DRAPE C-ARM 42X72 X-RAY (DRAPES) ×4 IMPLANT
DRAPE LAPAROTOMY 100X72 PEDS (DRAPES) ×2 IMPLANT
DRAPE MICROSCOPE SLANT 54X150 (MISCELLANEOUS) ×2 IMPLANT
DURAPREP 6ML APPLICATOR 50/CS (WOUND CARE) ×2 IMPLANT
ELECT COATED BLADE 2.86 ST (ELECTRODE) ×2 IMPLANT
ELECT REM PT RETURN 9FT ADLT (ELECTROSURGICAL) ×1 IMPLANT
ELECTRODE REM PT RTRN 9FT ADLT (ELECTROSURGICAL) ×2 IMPLANT
GAUZE 4X4 16PLY ~~LOC~~+RFID DBL (SPONGE) IMPLANT
GAUZE SPONGE 4X4 12PLY STRL (GAUZE/BANDAGES/DRESSINGS) ×2 IMPLANT
GLOVE ECLIPSE 9.0 STRL (GLOVE) ×2 IMPLANT
GLOVE EXAM NITRILE XL STR (GLOVE) IMPLANT
GOWN STRL REUS W/ TWL LRG LVL3 (GOWN DISPOSABLE) IMPLANT
GOWN STRL REUS W/ TWL XL LVL3 (GOWN DISPOSABLE) IMPLANT
GOWN STRL REUS W/TWL 2XL LVL3 (GOWN DISPOSABLE) IMPLANT
HALTER HD/CHIN CERV TRACTION D (MISCELLANEOUS) ×2 IMPLANT
HEMOSTAT POWDER KIT SURGIFOAM (HEMOSTASIS) ×2 IMPLANT
KIT BASIN OR (CUSTOM PROCEDURE TRAY) ×2 IMPLANT
KIT TURNOVER KIT B (KITS) ×2 IMPLANT
NDL SPNL 20GX3.5 QUINCKE YW (NEEDLE) ×2 IMPLANT
NEEDLE SPNL 20GX3.5 QUINCKE YW (NEEDLE) ×1 IMPLANT
NS IRRIG 1000ML POUR BTL (IV SOLUTION) ×2 IMPLANT
PACK LAMINECTOMY NEURO (CUSTOM PROCEDURE TRAY) ×2 IMPLANT
PAD ARMBOARD 7.5X6 YLW CONV (MISCELLANEOUS) ×6 IMPLANT
PLATE 3 55XLCK NS SPNE CVD (Plate) IMPLANT
SCREW ST FIX 4 ATL 3120213 (Screw) IMPLANT
SPACER SPNL 11X14X6XPEEK CVD (Cage) IMPLANT
SPCR SPNL 11X14X6XPEEK CVD (Cage) ×3 IMPLANT
SPONGE INTESTINAL PEANUT (DISPOSABLE) ×2 IMPLANT
SPONGE SURGIFOAM ABS GEL 100 (HEMOSTASIS) ×2 IMPLANT
STRIP CLOSURE SKIN 1/2X4 (GAUZE/BANDAGES/DRESSINGS) ×2 IMPLANT
SUT VIC AB 3-0 SH 8-18 (SUTURE) ×2 IMPLANT
SUT VIC AB 4-0 RB1 18 (SUTURE) ×2 IMPLANT
TAPE CLOTH 4X10 WHT NS (GAUZE/BANDAGES/DRESSINGS) ×2 IMPLANT
TAPE CLOTH SURG 4X10 WHT LF (GAUZE/BANDAGES/DRESSINGS) IMPLANT
TOWEL GREEN STERILE (TOWEL DISPOSABLE) ×2 IMPLANT
TOWEL GREEN STERILE FF (TOWEL DISPOSABLE) ×2 IMPLANT
TRAP SPECIMEN MUCUS 40CC (MISCELLANEOUS) ×2 IMPLANT
WATER STERILE IRR 1000ML POUR (IV SOLUTION) ×2 IMPLANT

## 2023-06-04 NOTE — Transfer of Care (Signed)
 Immediate Anesthesia Transfer of Care Note  Patient: Kimberly Hahn  Procedure(s) Performed: Anterior Cervical Decompression Fusion - Cervical four-Cervical five - Cervical five-Cervical six - Cervical six-Cervical seven (Spine Cervical)  Patient Location: PACU  Anesthesia Type:General  Level of Consciousness: awake and drowsy  Airway & Oxygen Therapy: Patient Spontanous Breathing and Patient connected to face mask oxygen  Post-op Assessment: Report given to RN and Post -op Vital signs reviewed and stable  Post vital signs: Reviewed and stable  Last Vitals:  Vitals Value Taken Time  BP 140/107 06/04/23 1020  Temp 36.5 C 06/04/23 1020  Pulse 83 06/04/23 1023  Resp 12 06/04/23 1023  SpO2 100 % 06/04/23 1023  Vitals shown include unfiled device data.  Last Pain:  Vitals:   06/04/23 1020  TempSrc:   PainSc: Asleep      Patients Stated Pain Goal: 0 (06/04/23 0705)  Complications: No notable events documented.

## 2023-06-04 NOTE — Brief Op Note (Signed)
 06/04/2023  10:10 AM  PATIENT:  Kimberly Hahn  48 y.o. female  PRE-OPERATIVE DIAGNOSIS:  Spondylosis  POST-OPERATIVE DIAGNOSIS:  Spondylosis  PROCEDURE:  Procedure(s): Anterior Cervical Decompression Fusion - Cervical four-Cervical five - Cervical five-Cervical six - Cervical six-Cervical seven (N/A)  SURGEON:  Surgeons and Role:    DEWAINE Louis Shove, MD - Primary  PHYSICIAN ASSISTANT:   ASSISTANTSBETHA Jennetta PIETY   ANESTHESIA:   general  EBL:  75cc  BLOOD ADMINISTERED:none  DRAINS: none   LOCAL MEDICATIONS USED:  NONE  SPECIMEN:  No Specimen  DISPOSITION OF SPECIMEN:  N/A  COUNTS:  YES  TOURNIQUET:  * No tourniquets in log *  DICTATION: .Dragon Dictation  PLAN OF CARE: Admit to inpatient   PATIENT DISPOSITION:  PACU - hemodynamically stable.   Delay start of Pharmacological VTE agent (>24hrs) due to surgical blood loss or risk of bleeding: yes

## 2023-06-04 NOTE — H&P (Signed)
 Kimberly Hahn is an 48 y.o. female.   Chief Complaint: Neck pain HPI: 48 year old female with chronic and progressive worsening posterior cervical pain with radiation into her upper extremities.  Workup demonstrates evidence of marked multilevel cervical degeneration withcollapse and associated kyphotic angulation with associated stenosis.  Patient presents now for 3 level anterior cervical decompression and fusion in hopes of improving her symptoms.  Past Medical History:  Diagnosis Date   Arthritis    DDD (degenerative disc disease), cervical    Depression    Headache    High cholesterol    History of kidney stones    Hypertension    Hypothyroidism    Psychosis (HCC)    per PCP 's note   Substance abuse (HCC)    clean since 12/ 2021    Past Surgical History:  Procedure Laterality Date   APPENDECTOMY     CYSTOSCOPY     with stents- x 2    History reviewed. No pertinent family history. Social History:  reports that she quit smoking about 6 months ago. Her smoking use included e-cigarettes. She has never used smokeless tobacco. She reports that she does not currently use alcohol. She reports that she does not currently use drugs.  Allergies:  Allergies  Allergen Reactions   Morphine Anaphylaxis   Cephalexin Hives   Etodolac      Upset stomach   Propoxyphene N-Acetaminophen  Nausea Only and Rash    Medications Prior to Admission  Medication Sig Dispense Refill   gabapentin  (NEURONTIN ) 800 MG tablet Take 1 tablet (800 mg total) by mouth 4 (four) times daily. Take 1 tablet 4 times daily by mouth (Patient taking differently: Take 800 mg by mouth 3 (three) times daily.) 120 tablet 11   levothyroxine  (SYNTHROID ) 150 MCG tablet Take 150 mcg by mouth daily before breakfast.     tiZANidine  (ZANAFLEX ) 4 MG tablet TAKE ONE TABLET BY MOUTH UP TO THREE TIMES DAILY (Patient taking differently: Take 4 mg by mouth at bedtime as needed for muscle spasms.) 90 tablet 0   Vilazodone  HCl  (VIIBRYD ) 20 MG TABS Take 20 mg by mouth daily.     traMADol  (ULTRAM ) 50 MG tablet TAKE ONE TABLET IN THE MORNING, TWO TABLETS IN THE EVENING AND TWO TABLETS AT BEDTIME (Patient not taking: Reported on 12/21/2022) 150 tablet 0    Results for orders placed or performed during the hospital encounter of 06/04/23 (from the past 48 hours)  Pregnancy, urine POC     Status: None   Collection Time: 06/04/23  7:22 AM  Result Value Ref Range   Preg Test, Ur NEGATIVE NEGATIVE    Comment:        THE SENSITIVITY OF THIS METHODOLOGY IS >24 mIU/mL   Pregnancy, urine POC     Status: None   Collection Time: 06/04/23  7:36 AM  Result Value Ref Range   Preg Test, Ur NEGATIVE NEGATIVE    Comment:        THE SENSITIVITY OF THIS METHODOLOGY IS >24 mIU/mL    No results found.  Pertinent items noted in HPI and remainder of comprehensive ROS otherwise negative.  Blood pressure 119/82, pulse 83, temperature 98 F (36.7 C), temperature source Oral, resp. rate 18, height 5' 6 (1.676 m), weight 68.5 kg, last menstrual period 05/04/2023, SpO2 93%.  Patient is awake and alert.  She is oriented and appropriate.  Speech is fluent.  Judgment insight are intact.  Cranial nerve function normalized.  Motor examination reveals intact motor strength  bilaterally sensory examination essentially normal.  Reflexes normal active.  Gait and posture normal peer examination head ears eyes nose and throat is unremarked.  Chest and abdomen are benign.  Extremities are free from injury deformity. 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  Plan C4-5, C5-6, C6-7 anterior cervical discectomy with interbody fusion utilizing interbody cages, local harvested autograft, and anterior plate instrumentation.  Risks and benefits been explained.  Patient wishes to proceed.  Kimberly Hahn 06/04/2023, 7:52 AM

## 2023-06-04 NOTE — Progress Notes (Signed)
 Orthopedic Tech Progress Note Patient Details:  Kimberly Hahn [birth year] [id number]  Ortho Devices Type of Ortho Device: Soft collar Ortho Device/Splint Location: NECK Ortho Device/Splint Interventions: Ordered, Application, Adjustment   Post Interventions Patient Tolerated: Well Instructions Provided: Care of device  [name] Pac 06/04/2023, 10:42 AM

## 2023-06-04 NOTE — Op Note (Signed)
 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  Asst. Surgeon: Jennetta, NP  Anesthesia: General  Indication: 48 year old female with chronic and progressively worsening neck pain with bilateral upper extremity symptoms failing conservative management workup demonstrates evidence of severe cervical disc generation with the space collapse and kyphotic angulation and secondary stenosis with early cord and significant neuroforaminal stenosis at C4-5 C5-6 and C6-7.  Patient presents now for 3 level anterior cervical decompression and fusion in hopes of improving her symptoms.  Operative note: After induction of anesthesia, patient positioned supine with neck slightly extended.  Patient's anterior cervical region prepped draped sterilely.  Incision made overlying C5-6.  Dissection performed in the right.  Retractor placed.  Fluoroscopy used.  Levels confirmed.  Disc spaces at C4-5, C5-6 and C6-7 were incised.  Discectomy was then performed using various instruments down to level the posterior annulus.  Microscope then brought to the field used throughout the remainder of the scalp.  Anterior osteophytes were resected at all 3 levels.  Remaining aspects of annulus and osteophytes were then removed using high-speed drill at C4-5.  Decompression then proceeded down to the level of the posterior longitudinal ligament.  Posterior logical ligament was then elevated and resected in a piecemeal fashion.  Underlying thecal sac was then identified.  A wide central decompression was then performed by undercutting the bodies of C4 and C5.  Decompression then  proceeded into each neural foramina.  Wide anterior foraminotomies performed on the course exiting C5 nerve roots bilaterally.  At this point a very thorough decompression been achieved.  There was no evidence of injury to the thecal sac or nerve roots.  Procedures then repeated at C5-6 and C6-7 again without complicating features.  Wound was then irrigated.  Gelfoam was placed topically for hemostasis then removed.  [employer] anatomic peek cages and then packed with locally harvested autograft.  Each cage was then impacted into place at all 3 levels.  All 3 cages were recessed slightly from the anterior cortical margin.  Atlantis translational cervical plate was then placed over the C4-5-6 and 7 levels.  This then attached under fluoroscopic guidance using 13 mm fixed angle screws all screws given final tightening.  Locking screws engaged all 4 levels.  Final images reveal good position of the cages and the hardware at the proper operative level with normal alignment of the spine.  Wound was then irrigated 1 final time.  Hemostasis was assured.  Wounds then closed in layers with Vicryl sutures.  Steri-Strips and sterile dressing were applied.  No apparent complications.  Patient tolerated the procedure well and she returns to the recovery room postop.

## 2023-06-04 NOTE — Plan of Care (Signed)

## 2023-06-04 NOTE — Anesthesia Procedure Notes (Signed)
 Procedure Name: Intubation Date/Time: 06/04/2023 8:12 AM  Performed by: Cindie Ronal POUR, CRNAPre-anesthesia Checklist: Patient identified, Emergency Drugs available, Suction available and Patient being monitored Patient Re-evaluated:Patient Re-evaluated prior to induction Oxygen Delivery Method: Circle System Utilized Preoxygenation: Pre-oxygenation with 100% oxygen Induction Type: IV induction Ventilation: Mask ventilation without difficulty Laryngoscope Size: Glidescope and 3 (hyperangulated S3 disposable blade) Tube type: Oral Tube size: 7.0 mm Number of attempts: 1 Airway Equipment and Method: Stylet Placement Confirmation: ETT inserted through vocal cords under direct vision, positive ETCO2 and breath sounds checked- equal and bilateral Secured at: 22 cm Tube secured with: Tape Dental Injury: Teeth and Oropharynx as per pre-operative assessment

## 2023-06-05 ENCOUNTER — Encounter (HOSPITAL_COMMUNITY): Payer: Self-pay | Admitting: Neurosurgery

## 2023-06-05 MED ORDER — TIZANIDINE HCL 4 MG PO TABS: 4.0000 mg | ORAL_TABLET | Freq: Three times a day (TID) | ORAL | 0 refills | Status: AC | PRN

## 2023-06-05 MED ORDER — GABAPENTIN 800 MG PO TABS: 800.0000 mg | ORAL_TABLET | Freq: Three times a day (TID) | ORAL | 0 refills | Status: AC

## 2023-06-05 MED ORDER — HYDROCODONE-ACETAMINOPHEN 5-325 MG PO TABS: 1.0000 | ORAL_TABLET | ORAL | 0 refills | Status: AC | PRN

## 2023-06-05 NOTE — Progress Notes (Signed)
 Patient awaiting family for discharge home, Patient in no acute distress nor complaints of pain nor discomfort; incision on back is clean, dry and intact; No c/o pain at this time. Room was checked and accounted for all patient's belongings; discharge instructions concerning her medications, incision care, follow up appointment and when to call the doctor as needed were all discussed with patient by RN and she expressed understanding on the instructions given.

## 2023-06-05 NOTE — Discharge Instructions (Addendum)
 Wound Care Keep incision dry for three days.  Do not put any creams, lotions, or ointments on incision. Leave steri-strips on   They will fall off by themselves. Activity Walk each and every day, increasing distance each day. No lifting greater than 8 lbs.  Avoid excessive neck motion. No driving for 2 weeks; may ride as a passenger locally.  Diet Resume your normal diet.   Call Your Doctor If Any of These Occur Redness, drainage, or swelling at the wound.  Temperature greater than 101 degrees. Severe pain not relieved by pain medication. Incision starts to come apart. Follow Up Appt Call today for appointment in 1-2 weeks ([phone]) or for problems.  If you have any hardware placed in your spine, you will need an x-ray before your appointment.

## 2023-06-05 NOTE — Evaluation (Signed)
 Occupational Therapy Evaluation Patient Details Name: Kimberly Hahn MRN: [id number] DOB: [birth year] Today's Date: 06/05/2023   History of Present Illness Pt is a 48 y/o F s/p C4-5, C5-6, C6-7 ACDF on 1/13. PMH includes arthritis, DDD, depression, headache, high cholesterol, HTN, psychosis, substance abuse.   Clinical Impression   Pt reports ind at baseline with ADLs and functional mobility, plans to d/c to her aunt's fiance's home and will have his assist 24/7. Pt currently needing set up - min A for ADLs, CGA for bed mobility and transfers without AD. Pt educated on precautions (handout provided), collar wear, bed mobility, and compensatory strategies for ADLs. Pt presenting with impairments listed below, will follow acutely. Anticipate no OT follow up needs at d/c.       If plan is discharge home, recommend the following: A little help with bathing/dressing/bathroom;A little help with walking and/or transfers;Assistance with cooking/housework;Assist for transportation;Help with stairs or ramp for entrance    Functional Status Assessment  Patient has had a recent decline in their functional status and demonstrates the ability to make significant improvements in function in a reasonable and predictable amount of time.  Equipment Recommendations  None recommended by OT (pt has all needed DME)    Recommendations for Other Services       Precautions / Restrictions Precautions Precautions: Cervical Precaution Booklet Issued: Yes (comment) Precaution Comments: educated on cervical prec Required Braces or Orthoses: Cervical Brace Cervical Brace: Soft collar;At all times Restrictions Weight Bearing Restrictions Per Provider Order: No      Mobility Bed Mobility Overal bed mobility: Needs Assistance Bed Mobility: Sidelying to Sit, Rolling Rolling: Contact guard assist Sidelying to sit: Contact guard assist       General bed mobility comments: educated on log roll technique, but pt  reports she plans to sleep in recliner    Transfers Overall transfer level: Needs assistance Equipment used: None Transfers: Sit to/from Stand Sit to Stand: Contact guard assist                  Balance Overall balance assessment: Mild deficits observed, not formally tested                                         ADL either performed or assessed with clinical judgement   ADL Overall ADL's : Needs assistance/impaired Eating/Feeding: Set up   Grooming: Set up   Upper Body Bathing: Minimal assistance   Lower Body Bathing: Minimal assistance   Upper Body Dressing : Minimal assistance   Lower Body Dressing: Minimal assistance   Toilet Transfer: Contact guard assist;Ambulation;Regular Toilet   Toileting- Clothing Manipulation and Hygiene: Contact guard assist       Functional mobility during ADLs: Contact guard assist       Vision   Vision Assessment?: No apparent visual deficits     Perception Perception: Not tested       Praxis Praxis: Not tested       Pertinent Vitals/Pain Pain Assessment Pain Assessment: Faces Pain Score: 5  Faces Pain Scale: Hurts even more Pain Location: neck ( incision) Pain Descriptors / Indicators: Discomfort Pain Intervention(s): Limited activity within patient's tolerance, Monitored during session, Repositioned     Extremity/Trunk Assessment Upper Extremity Assessment Upper Extremity Assessment: Overall WFL for tasks assessed (reports sensation in LUE has improved since surgery)   Lower Extremity Assessment Lower Extremity Assessment: Overall WFL for tasks  assessed   Cervical / Trunk Assessment Cervical / Trunk Assessment: Neck Surgery   Communication Communication Communication: No apparent difficulties   Cognition Arousal: Alert Behavior During Therapy: WFL for tasks assessed/performed, Anxious Overall Cognitive Status: Within Functional Limits for tasks assessed                                  General Comments: anxious regarding precautions, mobility, surgery, etc cues for reassurance and redirection     General Comments  VSS on RA    Exercises     Shoulder Instructions      Home Living Family/patient expects to be discharged to:: Private residence Living Arrangements: Other (Comment) (aunt's fiance) Available Help at Discharge: Family;Available 24 hours/day Type of Home: House Home Access: Stairs to enter [employer] of Steps: 3 Entrance Stairs-Rails: Can reach both Home Layout: One level     Bathroom Shower/Tub: Producer, Television/film/video: Standard     Home Equipment: Shower seat;Hand held shower head          Prior Functioning/Environment Prior Level of Function : Independent/Modified Independent             Mobility Comments: no AD use ADLs Comments: ind        OT Problem List: Decreased strength;Impaired balance (sitting and/or standing);Decreased range of motion;Decreased knowledge of precautions      OT Treatment/Interventions: Self-care/ADL training;Therapeutic exercise;Energy conservation;DME and/or AE instruction;Therapeutic activities;Visual/perceptual remediation/compensation;Patient/family education;Balance training    OT Goals(Current goals can be found in the care plan section) Acute Rehab OT Goals Patient Stated Goal: none stated OT Goal Formulation: With patient Time For Goal Achievement: 06/19/23 Potential to Achieve Goals: Good  OT Frequency: Min 1X/week    Co-evaluation              AM-PAC OT 6 Clicks Daily Activity     Outcome Measure Help from another person eating meals?: None Help from another person taking care of personal grooming?: None Help from another person toileting, which includes using toliet, bedpan, or urinal?: A Little Help from another person bathing (including washing, rinsing, drying)?: A Little Help from another person to put on and taking off regular upper  body clothing?: A Little Help from another person to put on and taking off regular lower body clothing?: A Little 6 Click Score: 20   End of Session Equipment Utilized During Treatment: Cervical collar Nurse Communication: Mobility status;Patient requests pain meds  Activity Tolerance: Patient tolerated treatment well Patient left: in bed;with call bell/phone within reach  OT Visit Diagnosis: Unsteadiness on feet (R26.81);Other abnormalities of gait and mobility (R26.89);Muscle weakness (generalized) (M62.81)                Time: 9171-9093 OT Time Calculation (min): 38 min Charges:  OT General Charges $OT Visit: 1 Visit OT Evaluation $OT Eval Low Complexity: 1 Low OT Treatments $Self Care/Home Management : 8-22 mins $Therapeutic Activity: 8-22 mins  Kimberly Hahn K, OTD, OTR/L SecureChat Preferred Acute Rehab [phone]   Kimberly Hahn 06/05/2023, 9:19 AM

## 2023-06-05 NOTE — Anesthesia Postprocedure Evaluation (Signed)
 Anesthesia Post Note  Patient: [name]  Procedure(s) Performed: Anterior Cervical Decompression Fusion - Cervical four-Cervical five - Cervical five-Cervical six - Cervical six-Cervical seven (Spine Cervical)     Patient location during evaluation: PACU Anesthesia Type: General Level of consciousness: sedated and patient cooperative Pain management: pain level controlled Vital Signs Assessment: post-procedure vital signs reviewed and stable Respiratory status: spontaneous breathing Cardiovascular status: stable Anesthetic complications: no   No notable events documented.  Last Vitals:  Vitals:   06/05/23 0320 06/05/23 0727  BP: 121/78 127/76  Pulse: 77 91  Resp: 20 19  Temp: (!) 36.3 C 36.6 C  SpO2: 100% 100%    Last Pain:  Vitals:   06/05/23 0727  TempSrc: Oral  PainSc:                  Norleen Pope

## 2023-06-05 NOTE — Discharge Summary (Signed)
 Physician Discharge Summary  Patient ID: Kimberly Hahn MRN: [id number] DOB/AGE: 47-Aug-1977 47 y.o.  Admit date: 06/04/2023 Discharge date: 06/05/2023  Admission Diagnoses:  Discharge Diagnoses:  Principal Problem:   Cervical spondylosis with myelopathy and radiculopathy   Discharged Condition: good  Hospital Course: Patient admitted to the hospital where she underwent uncomplicated three-level anterior cervical decompression and fusion.  Postoperatively doing well.  Neck pain improved.  Upper extremity symptoms significantly better.  Standing ambulating and voiding without difficulty.  Ready for discharge home.  Consults:   Significant Diagnostic Studies:   Treatments:   Discharge Exam: Blood pressure 127/76, pulse 91, temperature 97.9 F (36.6 C), temperature source Oral, resp. rate 19, height 5' 6 (1.676 m), weight 68.5 kg, last menstrual period 05/04/2023, SpO2 100%. Awake and alert.  Oriented and appropriate.  Speech fluent.  Judgment insight intact.  Cranial nerve function normal bilateral.  Motor examination 5/5 bilateral.  Sensory examination intact.  Wound clean and dry.  Neck soft.  Chest and abdomen benign.  Disposition: Discharge disposition: 01-Home or Self Care        Allergies as of 06/05/2023       Reactions   Morphine Anaphylaxis   Cephalexin Hives   Etodolac     Upset stomach   Propoxyphene N-acetaminophen  Nausea Only, Rash        Medication List     STOP taking these medications    traMADol  50 MG tablet Commonly known as: ULTRAM        TAKE these medications    gabapentin  800 MG tablet Commonly known as: NEURONTIN  Take 1 tablet (800 mg total) by mouth 3 (three) times daily.   HYDROcodone -acetaminophen  5-325 MG tablet Commonly known as: Norco Take 1 tablet by mouth every 4 (four) hours as needed for moderate pain (pain score 4-6).   levothyroxine  150 MCG tablet Commonly known as: SYNTHROID  Take 150 mcg by mouth daily before  breakfast.   tiZANidine  4 MG tablet Commonly known as: ZANAFLEX  TAKE ONE TABLET BY MOUTH UP TO THREE TIMES DAILY What changed:  how much to take how to take this when to take this reasons to take this additional instructions   tiZANidine  4 MG tablet Commonly known as: ZANAFLEX  Take 1 tablet (4 mg total) by mouth every 8 (eight) hours as needed for muscle spasms. What changed: You were already taking a medication with the same name, and this prescription was added. Make sure you understand how and when to take each.   Viibryd  20 MG Tabs Generic drug: Vilazodone  HCl Take 20 mg by mouth daily.         Signed: Victory Hahn Kimberly Hahn 06/05/2023, 8:20 AM
